# Patient Record
Sex: Female | Born: 1955 | Race: White | Hispanic: No | Smoking: Former smoker
Health system: Southern US, Community
[De-identification: ages and names within clinical notes are randomized; demographics above are authoritative.]

## PROBLEM LIST (undated history)

## (undated) DIAGNOSIS — F32A Depression, unspecified: Secondary | ICD-10-CM

## (undated) DIAGNOSIS — A498 Other bacterial infections of unspecified site: Secondary | ICD-10-CM

## (undated) DIAGNOSIS — K529 Noninfective gastroenteritis and colitis, unspecified: Secondary | ICD-10-CM

## (undated) DIAGNOSIS — G4700 Insomnia, unspecified: Secondary | ICD-10-CM

## (undated) DIAGNOSIS — N83202 Unspecified ovarian cyst, left side: Secondary | ICD-10-CM

## (undated) DIAGNOSIS — M5136 Other intervertebral disc degeneration, lumbar region: Secondary | ICD-10-CM

## (undated) DIAGNOSIS — M4800 Spinal stenosis, site unspecified: Secondary | ICD-10-CM

## (undated) DIAGNOSIS — R002 Palpitations: Secondary | ICD-10-CM

## (undated) DIAGNOSIS — I10 Essential (primary) hypertension: Secondary | ICD-10-CM

## (undated) DIAGNOSIS — D509 Iron deficiency anemia, unspecified: Secondary | ICD-10-CM

## (undated) DIAGNOSIS — C439 Malignant melanoma of skin, unspecified: Secondary | ICD-10-CM

## (undated) DIAGNOSIS — M51369 Other intervertebral disc degeneration, lumbar region without mention of lumbar back pain or lower extremity pain: Secondary | ICD-10-CM

## (undated) DIAGNOSIS — F329 Major depressive disorder, single episode, unspecified: Secondary | ICD-10-CM

## (undated) DIAGNOSIS — E785 Hyperlipidemia, unspecified: Secondary | ICD-10-CM

## (undated) DIAGNOSIS — F419 Anxiety disorder, unspecified: Secondary | ICD-10-CM

## (undated) HISTORY — PX: SHOULDER ARTHROSCOPY DISTAL CLAVICLE EXCISION AND OPEN ROTATOR CUFF REPAIR: SHX2396

## (undated) HISTORY — DX: Malignant melanoma of skin, unspecified: C43.9

## (undated) HISTORY — PX: COLONOSCOPY: SHX174

## (undated) HISTORY — DX: Major depressive disorder, single episode, unspecified: F32.9

## (undated) HISTORY — DX: Anxiety disorder, unspecified: F41.9

## (undated) HISTORY — PX: POLYPECTOMY: SHX149

## (undated) HISTORY — DX: Hyperlipidemia, unspecified: E78.5

## (undated) HISTORY — DX: Iron deficiency anemia, unspecified: D50.9

## (undated) HISTORY — DX: Essential (primary) hypertension: I10

## (undated) HISTORY — DX: Palpitations: R00.2

## (undated) HISTORY — DX: Unspecified ovarian cyst, left side: N83.202

## (undated) HISTORY — PX: MELANOMA EXCISION: SHX5266

## (undated) HISTORY — DX: Other intervertebral disc degeneration, lumbar region without mention of lumbar back pain or lower extremity pain: M51.369

## (undated) HISTORY — DX: Insomnia, unspecified: G47.00

## (undated) HISTORY — DX: Other bacterial infections of unspecified site: A49.8

## (undated) HISTORY — PX: TUBAL LIGATION: SHX77

## (undated) HISTORY — DX: Noninfective gastroenteritis and colitis, unspecified: K52.9

## (undated) HISTORY — PX: MOUTH SURGERY: SHX715

## (undated) HISTORY — DX: Depression, unspecified: F32.A

## (undated) HISTORY — DX: Other intervertebral disc degeneration, lumbar region: M51.36

## (undated) HISTORY — PX: OTHER SURGICAL HISTORY: SHX169

## (undated) HISTORY — PX: UPPER GASTROINTESTINAL ENDOSCOPY: SHX188

## (undated) HISTORY — DX: Spinal stenosis, site unspecified: M48.00

## (undated) HISTORY — PX: BREAST SURGERY: SHX581

---

## 1988-11-07 HISTORY — PX: OVARIAN CYST REMOVAL: SHX89

## 1998-09-21 ENCOUNTER — Other Ambulatory Visit: Admission: RE | Admit: 1998-09-21 | Discharge: 1998-09-21 | Payer: Self-pay | Admitting: Obstetrics & Gynecology

## 1999-12-06 ENCOUNTER — Other Ambulatory Visit: Admission: RE | Admit: 1999-12-06 | Discharge: 1999-12-06 | Payer: Self-pay | Admitting: Obstetrics & Gynecology

## 2001-02-23 ENCOUNTER — Other Ambulatory Visit: Admission: RE | Admit: 2001-02-23 | Discharge: 2001-02-23 | Payer: Self-pay | Admitting: Obstetrics & Gynecology

## 2002-03-06 ENCOUNTER — Other Ambulatory Visit: Admission: RE | Admit: 2002-03-06 | Discharge: 2002-03-06 | Payer: Self-pay | Admitting: Family Medicine

## 2003-03-31 ENCOUNTER — Other Ambulatory Visit: Admission: RE | Admit: 2003-03-31 | Discharge: 2003-03-31 | Payer: Self-pay | Admitting: Obstetrics & Gynecology

## 2003-05-08 ENCOUNTER — Encounter: Admission: RE | Admit: 2003-05-08 | Discharge: 2003-05-08 | Payer: Self-pay | Admitting: Obstetrics & Gynecology

## 2003-05-08 ENCOUNTER — Encounter: Payer: Self-pay | Admitting: Obstetrics & Gynecology

## 2004-07-16 ENCOUNTER — Encounter: Admission: RE | Admit: 2004-07-16 | Discharge: 2004-07-16 | Payer: Self-pay | Admitting: Obstetrics & Gynecology

## 2004-08-02 ENCOUNTER — Other Ambulatory Visit: Admission: RE | Admit: 2004-08-02 | Discharge: 2004-08-02 | Payer: Self-pay | Admitting: Obstetrics & Gynecology

## 2005-08-18 ENCOUNTER — Encounter: Admission: RE | Admit: 2005-08-18 | Discharge: 2005-08-18 | Payer: Self-pay | Admitting: Internal Medicine

## 2005-09-05 ENCOUNTER — Other Ambulatory Visit: Admission: RE | Admit: 2005-09-05 | Discharge: 2005-09-05 | Payer: Self-pay | Admitting: Obstetrics & Gynecology

## 2006-08-15 ENCOUNTER — Ambulatory Visit: Payer: Self-pay | Admitting: Gastroenterology

## 2006-09-14 ENCOUNTER — Encounter: Admission: RE | Admit: 2006-09-14 | Discharge: 2006-09-14 | Payer: Self-pay | Admitting: Internal Medicine

## 2006-10-13 ENCOUNTER — Encounter: Admission: RE | Admit: 2006-10-13 | Discharge: 2006-10-13 | Payer: Self-pay | Admitting: Internal Medicine

## 2006-11-08 ENCOUNTER — Encounter: Admission: RE | Admit: 2006-11-08 | Discharge: 2006-11-08 | Payer: Self-pay | Admitting: General Surgery

## 2007-10-12 ENCOUNTER — Encounter: Admission: RE | Admit: 2007-10-12 | Discharge: 2007-10-12 | Payer: Self-pay | Admitting: Internal Medicine

## 2007-10-30 ENCOUNTER — Encounter: Admission: RE | Admit: 2007-10-30 | Discharge: 2007-10-30 | Payer: Self-pay | Admitting: Internal Medicine

## 2008-01-11 ENCOUNTER — Encounter: Admission: RE | Admit: 2008-01-11 | Discharge: 2008-01-11 | Payer: Self-pay | Admitting: Internal Medicine

## 2008-10-29 ENCOUNTER — Encounter: Admission: RE | Admit: 2008-10-29 | Discharge: 2008-10-29 | Payer: Self-pay | Admitting: Internal Medicine

## 2008-11-06 ENCOUNTER — Encounter: Admission: RE | Admit: 2008-11-06 | Discharge: 2008-11-06 | Payer: Self-pay | Admitting: Internal Medicine

## 2009-10-08 DIAGNOSIS — N83209 Unspecified ovarian cyst, unspecified side: Secondary | ICD-10-CM | POA: Insufficient documentation

## 2009-11-07 HISTORY — PX: MENISCUS REPAIR: SHX5179

## 2010-01-01 ENCOUNTER — Encounter: Admission: RE | Admit: 2010-01-01 | Discharge: 2010-01-01 | Payer: Self-pay | Admitting: Internal Medicine

## 2010-09-24 ENCOUNTER — Encounter: Admission: RE | Admit: 2010-09-24 | Discharge: 2010-09-24 | Payer: Self-pay | Admitting: Internal Medicine

## 2010-11-07 HISTORY — PX: DILATION AND CURETTAGE OF UTERUS: SHX78

## 2010-11-28 ENCOUNTER — Encounter (HOSPITAL_BASED_OUTPATIENT_CLINIC_OR_DEPARTMENT_OTHER): Payer: Self-pay | Admitting: Internal Medicine

## 2010-12-08 ENCOUNTER — Other Ambulatory Visit: Payer: Self-pay | Admitting: Obstetrics & Gynecology

## 2010-12-08 DIAGNOSIS — Z1239 Encounter for other screening for malignant neoplasm of breast: Secondary | ICD-10-CM

## 2010-12-31 ENCOUNTER — Ambulatory Visit
Admission: RE | Admit: 2010-12-31 | Discharge: 2010-12-31 | Disposition: A | Discharge status: Home / Self Care | Payer: BC Managed Care – PPO | Source: Ambulatory Visit | Attending: Obstetrics & Gynecology | Admitting: Obstetrics & Gynecology

## 2010-12-31 DIAGNOSIS — Z1239 Encounter for other screening for malignant neoplasm of breast: Secondary | ICD-10-CM

## 2011-11-08 HISTORY — PX: LAPAROSCOPIC TOTAL HYSTERECTOMY: SUR800

## 2012-01-16 ENCOUNTER — Other Ambulatory Visit: Payer: Self-pay | Admitting: Internal Medicine

## 2012-01-16 DIAGNOSIS — Z1231 Encounter for screening mammogram for malignant neoplasm of breast: Secondary | ICD-10-CM

## 2012-02-10 ENCOUNTER — Ambulatory Visit
Admission: RE | Admit: 2012-02-10 | Discharge: 2012-02-10 | Disposition: A | Discharge status: Home / Self Care | Payer: BC Managed Care – PPO | Source: Ambulatory Visit | Attending: Internal Medicine | Admitting: Internal Medicine

## 2012-02-10 DIAGNOSIS — Z1231 Encounter for screening mammogram for malignant neoplasm of breast: Secondary | ICD-10-CM

## 2012-03-30 ENCOUNTER — Other Ambulatory Visit: Payer: Self-pay | Admitting: Internal Medicine

## 2012-03-30 DIAGNOSIS — M549 Dorsalgia, unspecified: Secondary | ICD-10-CM

## 2012-04-04 ENCOUNTER — Ambulatory Visit
Admission: RE | Admit: 2012-04-04 | Discharge: 2012-04-04 | Disposition: A | Discharge status: Home / Self Care | Payer: BC Managed Care – PPO | Source: Ambulatory Visit | Attending: Internal Medicine | Admitting: Internal Medicine

## 2012-04-04 DIAGNOSIS — M549 Dorsalgia, unspecified: Secondary | ICD-10-CM

## 2012-11-16 DIAGNOSIS — E785 Hyperlipidemia, unspecified: Secondary | ICD-10-CM | POA: Insufficient documentation

## 2012-11-28 DIAGNOSIS — R195 Other fecal abnormalities: Secondary | ICD-10-CM | POA: Insufficient documentation

## 2012-12-24 ENCOUNTER — Encounter: Payer: Self-pay | Admitting: Gastroenterology

## 2012-12-25 ENCOUNTER — Encounter: Payer: Self-pay | Admitting: Gastroenterology

## 2012-12-25 ENCOUNTER — Encounter: Payer: Self-pay | Admitting: Internal Medicine

## 2012-12-26 ENCOUNTER — Ambulatory Visit: Payer: BC Managed Care – PPO | Admitting: Gastroenterology

## 2013-01-09 ENCOUNTER — Other Ambulatory Visit: Payer: Self-pay

## 2013-01-09 DIAGNOSIS — Z1231 Encounter for screening mammogram for malignant neoplasm of breast: Secondary | ICD-10-CM

## 2013-01-15 ENCOUNTER — Ambulatory Visit (INDEPENDENT_AMBULATORY_CARE_PROVIDER_SITE_OTHER): Payer: BC Managed Care – PPO | Admitting: Internal Medicine

## 2013-01-15 ENCOUNTER — Encounter: Payer: Self-pay | Admitting: Internal Medicine

## 2013-01-15 VITALS — BP 120/70 | HR 76 | Ht 65.75 in | Wt 182.0 lb

## 2013-01-15 DIAGNOSIS — R1310 Dysphagia, unspecified: Secondary | ICD-10-CM

## 2013-01-15 DIAGNOSIS — R195 Other fecal abnormalities: Secondary | ICD-10-CM

## 2013-01-15 DIAGNOSIS — K219 Gastro-esophageal reflux disease without esophagitis: Secondary | ICD-10-CM

## 2013-01-15 MED ORDER — MOVIPREP 100 G PO SOLR: 1.0000 | Freq: Once | ORAL | Status: DC

## 2013-01-15 NOTE — Patient Instructions (Addendum)
You have been scheduled for an endoscopy and colonoscopy with propofol. Please follow the written instructions given to you at your visit today. Please pick up your prep at the pharmacy within the next 1-3 days. If you use inhalers (even only as needed), please bring them with you on the day of your procedure.    

## 2013-01-15 NOTE — Progress Notes (Signed)
HISTORY OF PRESENT ILLNESS:  Erica Potts is a 57 y.o. female with hypertension, hyperlipidemia, anxiety, and degenerative disc disease. She is sent today regarding Hemoccult-positive stool. The patient underwent recent routine annual evaluation. Hemosure testing returned positive. Review of outside laboratories from January 2014 reveal normal CBC with hemoglobin 14.5. Normal comprehensive metabolic panel and urinalysis. The patient's GI review of systems is remarkable for increased reflux symptoms recently. This has necessitated increased use of as needed Prilosec 20 mg. She is currently using this about 3 times per week. She describes esophageal spasms or tightening of the pharyngeal area with liquids. She's had this for several years. Barium esophagram in November of 2011 (reviewed) to evaluate this was unremarkable. The patient's GI review of systems is otherwise remarkable for occasional constipation secondary to pain medication. She did undergo complete colonoscopy in December of 2007 with Dr. Elbert Ewings. Noe Gens. Correspondence, which has been reviewed, stated that the examination was normal and followup in 10 years recommended. The patient does take regular NSAIDs as well as aspirin. She denies melena or hematochezia. No family history of colon cancer  REVIEW OF SYSTEMS:  All non-GI ROS negative except for chronic back pain and chronic insomnia  Past Medical History  Diagnosis Date  . DDD (degenerative disc disease), lumbar   . Depression   . Insomnia   . Palpitations   . Ovarian cyst, left   . Anxiety   . Melanoma   . HTN (hypertension)   . HLD (hyperlipidemia)     Past Surgical History  Procedure Laterality Date  . Nipple biopsy    . Meniscus repair Left 2011  . Melanoma excision    . Mouth surgery Right     maxilary  . Ovarian cyst removal  1990  . Cesarean section  1983, 1986    x 2  . Tubal ligation    . Dilation and curettage of uterus  2012  . Laparoscopic total  hysterectomy  2013    Social History Erica Potts  reports that she quit smoking about 21 years ago. Her smoking use included Cigarettes. She smoked 1.00 pack per day. She has never used smokeless tobacco. She reports that  drinks alcohol. She reports that she does not use illicit drugs.  family history includes Heart attack in her mother and Prostate cancer in her father.  No Known Allergies     PHYSICAL EXAMINATION: Vital signs: BP 120/70  Pulse 76  Ht 5' 5.75" (1.67 m)  Wt 182 lb (82.555 kg)  BMI 29.6 kg/m2  Constitutional: generally well-appearing, no acute distress Psychiatric: alert and oriented x3, cooperative Eyes: extraocular movements intact, anicteric, conjunctiva pink Mouth: oral pharynx moist, no lesions Neck: supple no lymphadenopathy Cardiovascular: heart regular rate and rhythm, no murmur Lungs: clear to auscultation bilaterally Abdomen: soft, nontender, nondistended, no obvious ascites, no peritoneal signs, normal bowel sounds, no organomegaly Rectal: Deferred until colonoscopy Extremities: no lower extremity edema bilaterally Skin: no lesions on visible extremities Neuro: No focal deficits.   ASSESSMENT:  #1. Hemoccult-positive stool. Rule out GI mucosal lesions such as NSAID-induced lesion or colorectal neoplasia #2. GERD. Worsening symptoms recently #3. Liquid dysphagia. Likely spasm #4. Unremarkable colonoscopy, elsewhere, December 2007  PLAN:  #1. Colonoscopy and upper endoscopy.The nature of the procedure, as well as the risks, benefits, and alternatives were carefully and thoroughly reviewed with the patient. Ample time for discussion and questions allowed. The patient understood, was satisfied, and agreed to proceed. Movi prep prescribed. The patient instructed on its  use

## 2013-02-12 ENCOUNTER — Ambulatory Visit (AMBULATORY_SURGERY_CENTER): Payer: BC Managed Care – PPO | Admitting: Internal Medicine

## 2013-02-12 ENCOUNTER — Encounter: Payer: Self-pay | Admitting: Internal Medicine

## 2013-02-12 ENCOUNTER — Ambulatory Visit: Payer: BC Managed Care – PPO

## 2013-02-12 VITALS — BP 120/73 | HR 65 | Temp 98.2°F | Resp 21 | Ht 65.0 in | Wt 182.0 lb

## 2013-02-12 DIAGNOSIS — R1310 Dysphagia, unspecified: Secondary | ICD-10-CM

## 2013-02-12 DIAGNOSIS — D126 Benign neoplasm of colon, unspecified: Secondary | ICD-10-CM

## 2013-02-12 DIAGNOSIS — K219 Gastro-esophageal reflux disease without esophagitis: Secondary | ICD-10-CM

## 2013-02-12 DIAGNOSIS — R195 Other fecal abnormalities: Secondary | ICD-10-CM

## 2013-02-12 DIAGNOSIS — K259 Gastric ulcer, unspecified as acute or chronic, without hemorrhage or perforation: Secondary | ICD-10-CM

## 2013-02-12 MED ORDER — SODIUM CHLORIDE 0.9 % IV SOLN: 500.0000 mL | INTRAVENOUS | Status: DC

## 2013-02-12 MED ORDER — OMEPRAZOLE 40 MG PO CPDR: 40.0000 mg | DELAYED_RELEASE_CAPSULE | Freq: Every day | ORAL | Status: DC

## 2013-02-12 NOTE — Progress Notes (Signed)
Pt had to wait to speak to Dr. Marina Goodell to receive the procedure findings.maw

## 2013-02-12 NOTE — Progress Notes (Signed)
Called to room to assist during endoscopic procedure.  Patient ID and intended procedure confirmed with present staff. Received instructions for my participation in the procedure from the performing physician.  

## 2013-02-12 NOTE — Progress Notes (Signed)
Patient did not experience any of the following events: a burn prior to discharge; a fall within the facility; wrong site/side/patient/procedure/implant event; or a hospital transfer or hospital admission upon discharge from the facility. (G8907) Patient did not have preoperative order for IV antibiotic SSI prophylaxis. (G8918)  

## 2013-02-12 NOTE — Op Note (Signed)
[facility] [address] [place], [address]   ENDOSCOPY PROCEDURE REPORT  PATIENT: Erica Potts, Erica Potts  MR#: [id number] BIRTHDATE: 1956/06/20 , 56  yrs. old GENDER: Female ENDOSCOPIST: Roxy Cedar, MD REFERRED BY:  Jarome Matin, M.D. PROCEDURE DATE:  02/12/2013 PROCEDURE:  EGD w/ biopsy ASA CLASS:     Class II INDICATIONS:  Heme positive stool.   History of esophageal reflux. Vague dysphagia MEDICATIONS: MAC sedation, administered by CRNA and propofol (Diprivan) 200mg  IV TOPICAL ANESTHETIC: Cetacaine Spray  DESCRIPTION OF PROCEDURE: After the risks benefits and alternatives of the procedure were thoroughly explained, informed consent was obtained.  The LB GIF-H180 [id number] endoscope was introduced through the mouth and advanced to the second portion of the duodenum. Without limitations.  The instrument was slowly withdrawn as the mucosa was fully examined.      EXAM: The esophagus revealed distal esophagitis (Edema, friable) at the Z-line.  Otherwise normal esophagus.  The stomach revealed multiple antral erosions and an 8mm clean based ulcer.  CLO bx taken.  The duodenum was normal.  Retroflexed views revealed no abnormalities.     The scope was then withdrawn from the patient and the procedure completed.  COMPLICATIONS: There were no complications. ENDOSCOPIC IMPRESSION: 1. Esophagitis 2. GERD 3. Antral ulcer and erosions likely secondary to NSAIDS.  RECOMMENDATIONS: 1.  PRESCRIBE OMEPRAZOLE 40 MG DAILY; #30; 11 REFILLS 2.  Avoid NSAIDS (LIKE MOBIC, ADVIL, ALEVE, etc) if possible 3.  Rx CLO if positive 4.  OP GI follow-up is advised on a PRN basis.  REPEAT EXAM:  eSigned:  Roxy Cedar, MD 02/12/2013 3:09 PM  AO:[id number] Eloise Harman, MD and The Patient

## 2013-02-12 NOTE — Patient Instructions (Addendum)
Handouts were given to your care partner on polyps, diverticulosis, high fiber diet, and GERD.  Avoid NSAIDS like mobic, advil, aleve if possible.  You may resume your other current medications.  A rx was sent to [facility] at [address] for omeprazole to take daily 20 - 30 minutes before breakfast.  Please call if any questions or concerns.    YOU HAD AN ENDOSCOPIC PROCEDURE TODAY AT THE [facility]: Refer to the procedure report that was given to you for any specific questions about what was found during the examination.  If the procedure report does not answer your questions, please call your gastroenterologist to clarify.  If you requested that your care partner not be given the details of your procedure findings, then the procedure report has been included in a sealed envelope for you to review at your convenience later.  YOU SHOULD EXPECT: Some feelings of bloating in the abdomen. Passage of more gas than usual.  Walking can help get rid of the air that was put into your GI tract during the procedure and reduce the bloating. If you had a lower endoscopy (such as a colonoscopy or flexible sigmoidoscopy) you may notice spotting of blood in your stool or on the toilet paper. If you underwent a bowel prep for your procedure, then you may not have a normal bowel movement for a few days.  DIET: Your first meal following the procedure should be a light meal and then it is ok to progress to your normal diet.  A half-sandwich or bowl of soup is an example of a good first meal.  Heavy or fried foods are harder to digest and may make you feel nauseous or bloated.  Likewise meals heavy in dairy and vegetables can cause extra gas to form and this can also increase the bloating.  Drink plenty of fluids but you should avoid alcoholic beverages for 24 hours.  ACTIVITY: Your care partner should take you home directly after the procedure.  You should plan to take it easy, moving slowly  for the rest of the day.  You can resume normal activity the day after the procedure however you should NOT DRIVE or use heavy machinery for 24 hours (because of the sedation medicines used during the test).    SYMPTOMS TO REPORT IMMEDIATELY: A gastroenterologist can be reached at any hour.  During normal business hours, 8:30 AM to 5:00 PM Monday through Friday, call [phone].  After hours and on weekends, please call the GI answering service at [phone] who will take a message and have the physician on call contact you.   Following lower endoscopy (colonoscopy or flexible sigmoidoscopy):  Excessive amounts of blood in the stool  Significant tenderness or worsening of abdominal pains  Swelling of the abdomen that is new, acute  Fever of 100F or higher  Following upper endoscopy (EGD)  Vomiting of blood or coffee ground material  New chest pain or pain under the shoulder blades  Painful or persistently difficult swallowing  New shortness of breath  Fever of 100F or higher  Black, tarry-looking stools  FOLLOW UP: If any biopsies were taken you will be contacted by phone or by letter within the next 1-3 weeks.  Call your gastroenterologist if you have not heard about the biopsies in 3 weeks.  Our staff will call the home number listed on your records the next business day following your procedure to check on you and address any questions  or concerns that you may have at that time regarding the information given to you following your procedure. This is a courtesy call and so if there is no answer at the home number and we have not heard from you through the emergency physician on call, we will assume that you have returned to your regular daily activities without incident.  SIGNATURES/CONFIDENTIALITY: You and/or your care partner have signed paperwork which will be entered into your electronic medical record.  These signatures attest to the fact that that the information above on  your After Visit Summary has been reviewed and is understood.  Full responsibility of the confidentiality of this discharge information lies with you and/or your care-partner.

## 2013-02-12 NOTE — Progress Notes (Signed)
No complaints noted in the recovery room. Maw   

## 2013-02-13 ENCOUNTER — Telehealth: Payer: Self-pay | Admitting: *Deleted

## 2013-02-13 NOTE — Telephone Encounter (Signed)
  Follow up Call-  Call back number 02/12/2013  Post procedure Call Back phone  # [phone]  Permission to leave phone message Yes     Patient questions:  Do you have a fever, pain , or abdominal swelling? no Pain Score  0 *  Have you tolerated food without any problems? yes  Have you been able to return to your normal activities? yes  Do you have any questions about your discharge instructions: Diet   no Medications  no Follow up visit  no  Do you have questions or concerns about your Care? no  Actions: * If pain score is 4 or above: No action needed, pain <4.

## 2013-02-15 ENCOUNTER — Ambulatory Visit: Payer: BC Managed Care – PPO

## 2013-02-18 ENCOUNTER — Encounter: Payer: Self-pay | Admitting: Internal Medicine

## 2013-02-18 NOTE — Op Note (Signed)
[facility] [address] [place], [address]   COLONOSCOPY PROCEDURE REPORT  PATIENT: Erica Potts, Erica Potts  MR#: [id number] BIRTHDATE: Mar 14, 1956 , 56  yrs. old GENDER: Female ENDOSCOPIST: Roxy Cedar, MD REFERRED NF:[id number] Eloise Harman, M.D. PROCEDURE DATE:  02/12/2013 PROCEDURE:   Colonoscopy with snare polypectomy    x 2. ASA CLASS:   Class II INDICATIONS:heme-positive stool.   Colonoscopy [place] 10-2006 w/ Diverticulosis MEDICATIONS: MAC sedation, administered by CRNA and propofol (Diprivan) 400mg  IV  DESCRIPTION OF PROCEDURE:   After the risks benefits and alternatives of the procedure were thoroughly explained, informed consent was obtained.  A digital rectal exam revealed no abnormalities of the rectum.   The LB CF-H180AL [id number]  endoscope was introduced through the anus and advanced to the cecum, which was identified by both the appendix and ileocecal valve. No adverse events experienced.   The quality of the prep was good, using MoviPrep  The instrument was then slowly withdrawn as the colon was fully examined.      COLON FINDINGS: Two polyps were found in the ascending colon (7mm sessile)  and rectum (31mm)These were removed with Cold snare, retrieved, and submitted to pathology.   Moderate diverticulosis was noted in the sigmoid colon.   The colon mucosa was otherwise normal.  Retroflexed views revealed internal hemorrhoids. The time to cecum=2 minutes 58 seconds.  Withdrawal time=13 minutes 12 seconds.  The scope was withdrawn and the procedure completed. COMPLICATIONS: There were no complications.  ENDOSCOPIC IMPRESSION: 1.   Two polyps measuring 5-7 mm in size were found in the ascending colon and rectum and removed 2.   Moderate diverticulosis was noted in the sigmoid colon 3.   The colon mucosa was otherwise normal  RECOMMENDATIONS: 1.  Follow up colonoscopy in 5 years 2.  Upper endoscopy today (see report)   eSigned:  Roxy Cedar, MD 02/18/2013 3:55 PM Revised: 02/18/2013 3:55 PM  cc: Jarome Matin, MD and The Patient   PATIENT NAME:  Erica Potts, Erica Potts MR#: [id number]

## 2013-02-19 ENCOUNTER — Ambulatory Visit
Admission: RE | Admit: 2013-02-19 | Discharge: 2013-02-19 | Disposition: A | Discharge status: Home / Self Care | Payer: BC Managed Care – PPO | Source: Ambulatory Visit

## 2013-02-19 DIAGNOSIS — Z1231 Encounter for screening mammogram for malignant neoplasm of breast: Secondary | ICD-10-CM

## 2013-02-26 ENCOUNTER — Telehealth: Payer: Self-pay | Admitting: Internal Medicine

## 2013-02-26 NOTE — Telephone Encounter (Signed)
Dr. Jesusita Oka [facility]'s Office requested Consult Note from 01/15/2013 on 02/18/2013  Faxed 7 pages Office Note from 01/15/2013 and Colon and Endo Reports from 02/12/2013  asw

## 2014-01-15 ENCOUNTER — Other Ambulatory Visit: Payer: Self-pay | Admitting: Internal Medicine

## 2014-04-17 ENCOUNTER — Other Ambulatory Visit: Payer: Self-pay | Admitting: Internal Medicine

## 2014-04-17 DIAGNOSIS — R200 Anesthesia of skin: Secondary | ICD-10-CM

## 2014-04-20 ENCOUNTER — Ambulatory Visit
Admission: RE | Admit: 2014-04-20 | Discharge: 2014-04-20 | Disposition: A | Discharge status: Home / Self Care | Payer: BC Managed Care – PPO | Source: Ambulatory Visit | Attending: Internal Medicine | Admitting: Internal Medicine

## 2014-04-20 DIAGNOSIS — R200 Anesthesia of skin: Secondary | ICD-10-CM

## 2014-05-05 ENCOUNTER — Other Ambulatory Visit: Payer: Self-pay

## 2014-05-05 DIAGNOSIS — Z1231 Encounter for screening mammogram for malignant neoplasm of breast: Secondary | ICD-10-CM

## 2014-06-16 ENCOUNTER — Ambulatory Visit
Admission: RE | Admit: 2014-06-16 | Discharge: 2014-06-16 | Disposition: A | Discharge status: Home / Self Care | Payer: BC Managed Care – PPO | Source: Ambulatory Visit

## 2014-06-16 DIAGNOSIS — Z1231 Encounter for screening mammogram for malignant neoplasm of breast: Secondary | ICD-10-CM

## 2014-06-17 ENCOUNTER — Other Ambulatory Visit: Payer: Self-pay | Admitting: Internal Medicine

## 2014-06-17 DIAGNOSIS — R928 Other abnormal and inconclusive findings on diagnostic imaging of breast: Secondary | ICD-10-CM

## 2014-06-25 ENCOUNTER — Ambulatory Visit
Admission: RE | Admit: 2014-06-25 | Discharge: 2014-06-25 | Disposition: A | Discharge status: Home / Self Care | Payer: BC Managed Care – PPO | Source: Ambulatory Visit | Attending: Internal Medicine | Admitting: Internal Medicine

## 2014-06-25 DIAGNOSIS — R928 Other abnormal and inconclusive findings on diagnostic imaging of breast: Secondary | ICD-10-CM

## 2014-12-31 ENCOUNTER — Other Ambulatory Visit: Payer: Self-pay | Admitting: Internal Medicine

## 2014-12-31 DIAGNOSIS — N6012 Diffuse cystic mastopathy of left breast: Secondary | ICD-10-CM

## 2015-01-21 ENCOUNTER — Other Ambulatory Visit: Payer: Self-pay | Admitting: Internal Medicine

## 2015-01-21 ENCOUNTER — Other Ambulatory Visit: Payer: Self-pay

## 2015-01-28 ENCOUNTER — Ambulatory Visit
Admission: RE | Admit: 2015-01-28 | Discharge: 2015-01-28 | Disposition: A | Discharge status: Home / Self Care | Payer: BLUE CROSS/BLUE SHIELD | Source: Ambulatory Visit | Attending: Internal Medicine | Admitting: Internal Medicine

## 2015-01-28 DIAGNOSIS — N6012 Diffuse cystic mastopathy of left breast: Secondary | ICD-10-CM

## 2015-02-26 ENCOUNTER — Other Ambulatory Visit: Payer: Self-pay | Admitting: Obstetrics & Gynecology

## 2015-02-27 LAB — CYTOLOGY - PAP

## 2015-03-09 ENCOUNTER — Other Ambulatory Visit (HOSPITAL_COMMUNITY): Payer: Self-pay | Admitting: Internal Medicine

## 2015-03-09 DIAGNOSIS — D72829 Elevated white blood cell count, unspecified: Secondary | ICD-10-CM

## 2015-03-16 ENCOUNTER — Encounter (HOSPITAL_COMMUNITY)
Admission: RE | Admit: 2015-03-16 | Discharge: 2015-03-16 | Disposition: A | Discharge status: Home / Self Care | Payer: BLUE CROSS/BLUE SHIELD | Source: Ambulatory Visit | Attending: Internal Medicine | Admitting: Internal Medicine

## 2015-03-16 ENCOUNTER — Other Ambulatory Visit: Payer: Self-pay | Admitting: Internal Medicine

## 2015-03-16 ENCOUNTER — Encounter (HOSPITAL_COMMUNITY): Payer: BLUE CROSS/BLUE SHIELD

## 2015-03-16 DIAGNOSIS — D72829 Elevated white blood cell count, unspecified: Secondary | ICD-10-CM | POA: Insufficient documentation

## 2015-03-16 MED ORDER — TECHNETIUM TC 99M MEDRONATE IV KIT
25.0000 | PACK | Freq: Once | INTRAVENOUS | Status: AC | PRN
  Administered 2015-03-16: 25 via INTRAVENOUS

## 2015-03-23 ENCOUNTER — Other Ambulatory Visit: Payer: Self-pay | Admitting: Internal Medicine

## 2015-05-17 ENCOUNTER — Other Ambulatory Visit: Payer: Self-pay | Admitting: Internal Medicine

## 2015-08-19 ENCOUNTER — Other Ambulatory Visit: Payer: Self-pay | Admitting: Internal Medicine

## 2015-08-19 ENCOUNTER — Other Ambulatory Visit: Payer: Self-pay | Admitting: Obstetrics & Gynecology

## 2015-08-19 DIAGNOSIS — N6320 Unspecified lump in the left breast, unspecified quadrant: Secondary | ICD-10-CM

## 2015-09-02 ENCOUNTER — Ambulatory Visit
Admission: RE | Admit: 2015-09-02 | Discharge: 2015-09-02 | Disposition: A | Discharge status: Home / Self Care | Payer: BLUE CROSS/BLUE SHIELD | Source: Ambulatory Visit | Attending: Obstetrics & Gynecology | Admitting: Obstetrics & Gynecology

## 2015-09-02 DIAGNOSIS — N6320 Unspecified lump in the left breast, unspecified quadrant: Secondary | ICD-10-CM

## 2016-02-18 DIAGNOSIS — M542 Cervicalgia: Secondary | ICD-10-CM | POA: Diagnosis not present

## 2016-03-07 DIAGNOSIS — M542 Cervicalgia: Secondary | ICD-10-CM | POA: Diagnosis not present

## 2016-03-10 DIAGNOSIS — Z6829 Body mass index (BMI) 29.0-29.9, adult: Secondary | ICD-10-CM | POA: Diagnosis not present

## 2016-03-10 DIAGNOSIS — Z01419 Encounter for gynecological examination (general) (routine) without abnormal findings: Secondary | ICD-10-CM | POA: Diagnosis not present

## 2016-03-29 DIAGNOSIS — M542 Cervicalgia: Secondary | ICD-10-CM | POA: Diagnosis not present

## 2016-04-07 DIAGNOSIS — M542 Cervicalgia: Secondary | ICD-10-CM | POA: Diagnosis not present

## 2016-04-19 DIAGNOSIS — M542 Cervicalgia: Secondary | ICD-10-CM | POA: Diagnosis not present

## 2016-05-04 DIAGNOSIS — M542 Cervicalgia: Secondary | ICD-10-CM | POA: Diagnosis not present

## 2016-05-11 DIAGNOSIS — M542 Cervicalgia: Secondary | ICD-10-CM | POA: Diagnosis not present

## 2016-05-23 DIAGNOSIS — M542 Cervicalgia: Secondary | ICD-10-CM | POA: Diagnosis not present

## 2016-06-13 DIAGNOSIS — M542 Cervicalgia: Secondary | ICD-10-CM | POA: Diagnosis not present

## 2016-06-27 DIAGNOSIS — M25561 Pain in right knee: Secondary | ICD-10-CM | POA: Diagnosis not present

## 2016-06-27 DIAGNOSIS — Z6829 Body mass index (BMI) 29.0-29.9, adult: Secondary | ICD-10-CM | POA: Diagnosis not present

## 2016-07-21 DIAGNOSIS — M542 Cervicalgia: Secondary | ICD-10-CM | POA: Diagnosis not present

## 2016-08-10 DIAGNOSIS — M542 Cervicalgia: Secondary | ICD-10-CM | POA: Diagnosis not present

## 2016-08-12 ENCOUNTER — Other Ambulatory Visit: Payer: Self-pay | Admitting: Obstetrics & Gynecology

## 2016-08-12 DIAGNOSIS — R921 Mammographic calcification found on diagnostic imaging of breast: Secondary | ICD-10-CM

## 2016-08-26 DIAGNOSIS — N390 Urinary tract infection, site not specified: Secondary | ICD-10-CM | POA: Diagnosis not present

## 2016-08-26 DIAGNOSIS — R8299 Other abnormal findings in urine: Secondary | ICD-10-CM | POA: Diagnosis not present

## 2016-08-31 DIAGNOSIS — M542 Cervicalgia: Secondary | ICD-10-CM | POA: Diagnosis not present

## 2016-09-13 DIAGNOSIS — H1013 Acute atopic conjunctivitis, bilateral: Secondary | ICD-10-CM | POA: Diagnosis not present

## 2016-09-15 DIAGNOSIS — S83241D Other tear of medial meniscus, current injury, right knee, subsequent encounter: Secondary | ICD-10-CM | POA: Diagnosis not present

## 2016-09-21 DIAGNOSIS — M542 Cervicalgia: Secondary | ICD-10-CM | POA: Diagnosis not present

## 2016-09-28 DIAGNOSIS — S83231A Complex tear of medial meniscus, current injury, right knee, initial encounter: Secondary | ICD-10-CM | POA: Diagnosis not present

## 2016-09-28 DIAGNOSIS — M94261 Chondromalacia, right knee: Secondary | ICD-10-CM | POA: Diagnosis not present

## 2016-09-28 DIAGNOSIS — S83281A Other tear of lateral meniscus, current injury, right knee, initial encounter: Secondary | ICD-10-CM | POA: Diagnosis not present

## 2016-09-28 DIAGNOSIS — Y999 Unspecified external cause status: Secondary | ICD-10-CM | POA: Diagnosis not present

## 2016-09-28 DIAGNOSIS — G8918 Other acute postprocedural pain: Secondary | ICD-10-CM | POA: Diagnosis not present

## 2016-09-28 DIAGNOSIS — S83241A Other tear of medial meniscus, current injury, right knee, initial encounter: Secondary | ICD-10-CM | POA: Diagnosis not present

## 2016-10-06 DIAGNOSIS — S83281D Other tear of lateral meniscus, current injury, right knee, subsequent encounter: Secondary | ICD-10-CM | POA: Diagnosis not present

## 2016-10-06 DIAGNOSIS — S83241D Other tear of medial meniscus, current injury, right knee, subsequent encounter: Secondary | ICD-10-CM | POA: Diagnosis not present

## 2016-10-12 DIAGNOSIS — M542 Cervicalgia: Secondary | ICD-10-CM | POA: Diagnosis not present

## 2016-10-14 ENCOUNTER — Other Ambulatory Visit: Payer: Self-pay | Admitting: Obstetrics & Gynecology

## 2016-10-14 ENCOUNTER — Ambulatory Visit
Admission: RE | Admit: 2016-10-14 | Discharge: 2016-10-14 | Disposition: A | Discharge status: Home / Self Care | Payer: BLUE CROSS/BLUE SHIELD | Source: Ambulatory Visit | Attending: Obstetrics & Gynecology | Admitting: Obstetrics & Gynecology

## 2016-10-14 DIAGNOSIS — N6320 Unspecified lump in the left breast, unspecified quadrant: Secondary | ICD-10-CM

## 2016-10-14 DIAGNOSIS — R921 Mammographic calcification found on diagnostic imaging of breast: Secondary | ICD-10-CM

## 2016-10-14 DIAGNOSIS — N6012 Diffuse cystic mastopathy of left breast: Secondary | ICD-10-CM | POA: Diagnosis not present

## 2016-10-20 DIAGNOSIS — L812 Freckles: Secondary | ICD-10-CM | POA: Diagnosis not present

## 2016-10-20 DIAGNOSIS — D2272 Melanocytic nevi of left lower limb, including hip: Secondary | ICD-10-CM | POA: Diagnosis not present

## 2016-10-20 DIAGNOSIS — D225 Melanocytic nevi of trunk: Secondary | ICD-10-CM | POA: Diagnosis not present

## 2016-10-20 DIAGNOSIS — Z85820 Personal history of malignant melanoma of skin: Secondary | ICD-10-CM | POA: Diagnosis not present

## 2016-11-03 DIAGNOSIS — S83281D Other tear of lateral meniscus, current injury, right knee, subsequent encounter: Secondary | ICD-10-CM | POA: Diagnosis not present

## 2016-11-03 DIAGNOSIS — S83241D Other tear of medial meniscus, current injury, right knee, subsequent encounter: Secondary | ICD-10-CM | POA: Diagnosis not present

## 2016-11-08 DIAGNOSIS — M542 Cervicalgia: Secondary | ICD-10-CM | POA: Diagnosis not present

## 2016-11-28 DIAGNOSIS — M542 Cervicalgia: Secondary | ICD-10-CM | POA: Diagnosis not present

## 2016-12-12 DIAGNOSIS — S83241D Other tear of medial meniscus, current injury, right knee, subsequent encounter: Secondary | ICD-10-CM | POA: Diagnosis not present

## 2016-12-19 DIAGNOSIS — M542 Cervicalgia: Secondary | ICD-10-CM | POA: Diagnosis not present

## 2016-12-29 DIAGNOSIS — E784 Other hyperlipidemia: Secondary | ICD-10-CM | POA: Diagnosis not present

## 2016-12-29 DIAGNOSIS — I10 Essential (primary) hypertension: Secondary | ICD-10-CM | POA: Diagnosis not present

## 2016-12-29 DIAGNOSIS — Z0000 Encounter for general adult medical examination without abnormal findings: Secondary | ICD-10-CM | POA: Diagnosis not present

## 2017-01-05 DIAGNOSIS — M545 Low back pain: Secondary | ICD-10-CM | POA: Diagnosis not present

## 2017-01-05 DIAGNOSIS — E784 Other hyperlipidemia: Secondary | ICD-10-CM | POA: Diagnosis not present

## 2017-01-05 DIAGNOSIS — Z0000 Encounter for general adult medical examination without abnormal findings: Secondary | ICD-10-CM | POA: Diagnosis not present

## 2017-01-05 DIAGNOSIS — G4709 Other insomnia: Secondary | ICD-10-CM | POA: Diagnosis not present

## 2017-01-05 DIAGNOSIS — Z1389 Encounter for screening for other disorder: Secondary | ICD-10-CM | POA: Diagnosis not present

## 2017-01-05 DIAGNOSIS — I10 Essential (primary) hypertension: Secondary | ICD-10-CM | POA: Diagnosis not present

## 2017-01-09 DIAGNOSIS — M542 Cervicalgia: Secondary | ICD-10-CM | POA: Diagnosis not present

## 2017-01-19 DIAGNOSIS — Z1212 Encounter for screening for malignant neoplasm of rectum: Secondary | ICD-10-CM | POA: Diagnosis not present

## 2017-02-02 DIAGNOSIS — M542 Cervicalgia: Secondary | ICD-10-CM | POA: Diagnosis not present

## 2017-02-23 DIAGNOSIS — M542 Cervicalgia: Secondary | ICD-10-CM | POA: Diagnosis not present

## 2017-03-10 ENCOUNTER — Encounter: Payer: Self-pay | Admitting: Internal Medicine

## 2017-03-13 DIAGNOSIS — M542 Cervicalgia: Secondary | ICD-10-CM | POA: Diagnosis not present

## 2017-03-21 DIAGNOSIS — Z6829 Body mass index (BMI) 29.0-29.9, adult: Secondary | ICD-10-CM | POA: Diagnosis not present

## 2017-03-21 DIAGNOSIS — M79671 Pain in right foot: Secondary | ICD-10-CM | POA: Diagnosis not present

## 2017-04-06 DIAGNOSIS — Z6828 Body mass index (BMI) 28.0-28.9, adult: Secondary | ICD-10-CM | POA: Diagnosis not present

## 2017-04-06 DIAGNOSIS — Z01419 Encounter for gynecological examination (general) (routine) without abnormal findings: Secondary | ICD-10-CM | POA: Diagnosis not present

## 2017-04-07 DIAGNOSIS — M542 Cervicalgia: Secondary | ICD-10-CM | POA: Diagnosis not present

## 2017-05-01 DIAGNOSIS — M542 Cervicalgia: Secondary | ICD-10-CM | POA: Diagnosis not present

## 2017-05-22 DIAGNOSIS — Z1212 Encounter for screening for malignant neoplasm of rectum: Secondary | ICD-10-CM | POA: Diagnosis not present

## 2017-05-22 DIAGNOSIS — M542 Cervicalgia: Secondary | ICD-10-CM | POA: Diagnosis not present

## 2017-06-19 DIAGNOSIS — M542 Cervicalgia: Secondary | ICD-10-CM | POA: Diagnosis not present

## 2017-07-13 DIAGNOSIS — M542 Cervicalgia: Secondary | ICD-10-CM | POA: Diagnosis not present

## 2017-08-03 DIAGNOSIS — M542 Cervicalgia: Secondary | ICD-10-CM | POA: Diagnosis not present

## 2017-08-30 DIAGNOSIS — M542 Cervicalgia: Secondary | ICD-10-CM | POA: Diagnosis not present

## 2017-09-03 DIAGNOSIS — Z23 Encounter for immunization: Secondary | ICD-10-CM | POA: Diagnosis not present

## 2017-09-20 DIAGNOSIS — M542 Cervicalgia: Secondary | ICD-10-CM | POA: Diagnosis not present

## 2017-10-12 DIAGNOSIS — M542 Cervicalgia: Secondary | ICD-10-CM | POA: Diagnosis not present

## 2017-10-23 DIAGNOSIS — Z85820 Personal history of malignant melanoma of skin: Secondary | ICD-10-CM | POA: Diagnosis not present

## 2017-10-23 DIAGNOSIS — L821 Other seborrheic keratosis: Secondary | ICD-10-CM | POA: Diagnosis not present

## 2017-10-23 DIAGNOSIS — L812 Freckles: Secondary | ICD-10-CM | POA: Diagnosis not present

## 2017-10-23 DIAGNOSIS — D225 Melanocytic nevi of trunk: Secondary | ICD-10-CM | POA: Diagnosis not present

## 2017-11-08 DIAGNOSIS — M542 Cervicalgia: Secondary | ICD-10-CM | POA: Diagnosis not present

## 2017-11-24 ENCOUNTER — Other Ambulatory Visit: Payer: Self-pay | Admitting: Internal Medicine

## 2017-11-24 DIAGNOSIS — Z1231 Encounter for screening mammogram for malignant neoplasm of breast: Secondary | ICD-10-CM

## 2017-11-27 DIAGNOSIS — M542 Cervicalgia: Secondary | ICD-10-CM | POA: Diagnosis not present

## 2017-12-18 DIAGNOSIS — M542 Cervicalgia: Secondary | ICD-10-CM | POA: Diagnosis not present

## 2017-12-21 ENCOUNTER — Ambulatory Visit
Admission: RE | Admit: 2017-12-21 | Discharge: 2017-12-21 | Disposition: A | Discharge status: Home / Self Care | Payer: BLUE CROSS/BLUE SHIELD | Source: Ambulatory Visit | Attending: Internal Medicine | Admitting: Internal Medicine

## 2017-12-21 DIAGNOSIS — Z1231 Encounter for screening mammogram for malignant neoplasm of breast: Secondary | ICD-10-CM | POA: Diagnosis not present

## 2018-01-04 DIAGNOSIS — Z0000 Encounter for general adult medical examination without abnormal findings: Secondary | ICD-10-CM | POA: Diagnosis not present

## 2018-01-04 DIAGNOSIS — I10 Essential (primary) hypertension: Secondary | ICD-10-CM | POA: Diagnosis not present

## 2018-01-04 DIAGNOSIS — R82998 Other abnormal findings in urine: Secondary | ICD-10-CM | POA: Diagnosis not present

## 2018-01-11 DIAGNOSIS — F418 Other specified anxiety disorders: Secondary | ICD-10-CM | POA: Diagnosis not present

## 2018-01-11 DIAGNOSIS — E7849 Other hyperlipidemia: Secondary | ICD-10-CM | POA: Diagnosis not present

## 2018-01-11 DIAGNOSIS — Z1389 Encounter for screening for other disorder: Secondary | ICD-10-CM | POA: Diagnosis not present

## 2018-01-11 DIAGNOSIS — Z0000 Encounter for general adult medical examination without abnormal findings: Secondary | ICD-10-CM | POA: Diagnosis not present

## 2018-01-11 DIAGNOSIS — F3289 Other specified depressive episodes: Secondary | ICD-10-CM | POA: Diagnosis not present

## 2018-01-11 DIAGNOSIS — G4709 Other insomnia: Secondary | ICD-10-CM | POA: Diagnosis not present

## 2018-01-12 DIAGNOSIS — Z1212 Encounter for screening for malignant neoplasm of rectum: Secondary | ICD-10-CM | POA: Diagnosis not present

## 2018-01-16 DIAGNOSIS — M542 Cervicalgia: Secondary | ICD-10-CM | POA: Diagnosis not present

## 2018-02-08 DIAGNOSIS — M542 Cervicalgia: Secondary | ICD-10-CM | POA: Diagnosis not present

## 2018-02-27 DIAGNOSIS — M542 Cervicalgia: Secondary | ICD-10-CM | POA: Diagnosis not present

## 2018-03-14 ENCOUNTER — Encounter: Payer: Self-pay | Admitting: Internal Medicine

## 2018-03-20 DIAGNOSIS — M542 Cervicalgia: Secondary | ICD-10-CM | POA: Diagnosis not present

## 2018-04-04 DIAGNOSIS — H9201 Otalgia, right ear: Secondary | ICD-10-CM | POA: Diagnosis not present

## 2018-04-16 DIAGNOSIS — M542 Cervicalgia: Secondary | ICD-10-CM | POA: Diagnosis not present

## 2018-04-30 DIAGNOSIS — Z6820 Body mass index (BMI) 20.0-20.9, adult: Secondary | ICD-10-CM | POA: Diagnosis not present

## 2018-04-30 DIAGNOSIS — Z01419 Encounter for gynecological examination (general) (routine) without abnormal findings: Secondary | ICD-10-CM | POA: Diagnosis not present

## 2018-05-14 ENCOUNTER — Encounter: Payer: Self-pay | Admitting: Internal Medicine

## 2018-05-28 DIAGNOSIS — M542 Cervicalgia: Secondary | ICD-10-CM | POA: Diagnosis not present

## 2018-06-18 DIAGNOSIS — M542 Cervicalgia: Secondary | ICD-10-CM | POA: Diagnosis not present

## 2018-07-06 DIAGNOSIS — H10413 Chronic giant papillary conjunctivitis, bilateral: Secondary | ICD-10-CM | POA: Diagnosis not present

## 2018-07-12 DIAGNOSIS — M542 Cervicalgia: Secondary | ICD-10-CM | POA: Diagnosis not present

## 2018-07-13 DIAGNOSIS — Z23 Encounter for immunization: Secondary | ICD-10-CM | POA: Diagnosis not present

## 2018-07-13 DIAGNOSIS — R05 Cough: Secondary | ICD-10-CM | POA: Diagnosis not present

## 2018-07-13 DIAGNOSIS — J40 Bronchitis, not specified as acute or chronic: Secondary | ICD-10-CM | POA: Diagnosis not present

## 2018-07-13 DIAGNOSIS — J329 Chronic sinusitis, unspecified: Secondary | ICD-10-CM | POA: Diagnosis not present

## 2018-07-19 DIAGNOSIS — R05 Cough: Secondary | ICD-10-CM | POA: Diagnosis not present

## 2018-07-19 DIAGNOSIS — J40 Bronchitis, not specified as acute or chronic: Secondary | ICD-10-CM | POA: Diagnosis not present

## 2018-07-19 DIAGNOSIS — Z6828 Body mass index (BMI) 28.0-28.9, adult: Secondary | ICD-10-CM | POA: Diagnosis not present

## 2018-07-19 DIAGNOSIS — I10 Essential (primary) hypertension: Secondary | ICD-10-CM | POA: Diagnosis not present

## 2018-07-20 ENCOUNTER — Ambulatory Visit (AMBULATORY_SURGERY_CENTER): Payer: Self-pay | Admitting: *Deleted

## 2018-07-20 ENCOUNTER — Telehealth: Payer: Self-pay | Admitting: *Deleted

## 2018-07-20 ENCOUNTER — Encounter: Payer: Self-pay | Admitting: Internal Medicine

## 2018-07-20 VITALS — Ht 66.5 in | Wt 177.0 lb

## 2018-07-20 DIAGNOSIS — Z86010 Personal history of colonic polyps: Secondary | ICD-10-CM

## 2018-07-20 MED ORDER — NA SULFATE-K SULFATE-MG SULF 17.5-3.13-1.6 GM/177ML PO SOLN
ORAL | 0 refills | Status: DC
Start: 2018-07-20 — End: 2018-08-03

## 2018-07-20 NOTE — Telephone Encounter (Signed)
Spoke with patient. She is aware the EGD will be added with colon date/time. Pt confirmed that she is taking the PPI daily.

## 2018-07-20 NOTE — Telephone Encounter (Signed)
Okay to add on EGD. Please make sure that she is taking her PPI every day

## 2018-07-20 NOTE — Progress Notes (Signed)
Patient denies any allergies to eggs or soy. Patient denies any problems with anesthesia/sedation. Patient denies any oxygen use at home. Patient denies taking any diet/weight loss medications or blood thinners. EMMI education offered, pt declined. Suprep coupon $50 given to pt.

## 2018-07-20 NOTE — Telephone Encounter (Signed)
Patient is for recall colon on 08/03/18,hx colon polyps. Pt last colon/EGD was 2014. She states she has had a bleeding ulcer in the past. During her EGD an ulcer was found and bx's done. She states she is taking medications that irritate her stomach, meloxicam, hydrocodone and is on prednisone. During pv today she was wondering if she should have a repeat EGD due to her hx. Pt denies any GI problems. [place] for EGD with colon? Please advise. Thank you, Robbin pv

## 2018-07-20 NOTE — Telephone Encounter (Signed)
Called pt. No answer, left message. Explained to pt that she may have the EGD with colon same appointment date and time. She did tell me during the pv today that she is taking the PPI omeprazole daily.

## 2018-08-03 ENCOUNTER — Encounter: Payer: Self-pay | Admitting: Internal Medicine

## 2018-08-03 ENCOUNTER — Ambulatory Visit (AMBULATORY_SURGERY_CENTER): Payer: BLUE CROSS/BLUE SHIELD | Admitting: Internal Medicine

## 2018-08-03 ENCOUNTER — Encounter: Payer: BLUE CROSS/BLUE SHIELD | Admitting: Internal Medicine

## 2018-08-03 VITALS — BP 117/68 | HR 79 | Temp 98.0°F | Resp 13 | Ht 65.0 in | Wt 182.0 lb

## 2018-08-03 DIAGNOSIS — B3781 Candidal esophagitis: Secondary | ICD-10-CM

## 2018-08-03 DIAGNOSIS — K5730 Diverticulosis of large intestine without perforation or abscess without bleeding: Secondary | ICD-10-CM

## 2018-08-03 DIAGNOSIS — Z8711 Personal history of peptic ulcer disease: Secondary | ICD-10-CM

## 2018-08-03 DIAGNOSIS — K317 Polyp of stomach and duodenum: Secondary | ICD-10-CM | POA: Diagnosis not present

## 2018-08-03 DIAGNOSIS — Z8719 Personal history of other diseases of the digestive system: Secondary | ICD-10-CM

## 2018-08-03 DIAGNOSIS — R1013 Epigastric pain: Secondary | ICD-10-CM

## 2018-08-03 DIAGNOSIS — Z86010 Personal history of colonic polyps: Secondary | ICD-10-CM | POA: Diagnosis not present

## 2018-08-03 DIAGNOSIS — Z1211 Encounter for screening for malignant neoplasm of colon: Secondary | ICD-10-CM | POA: Diagnosis not present

## 2018-08-03 DIAGNOSIS — K259 Gastric ulcer, unspecified as acute or chronic, without hemorrhage or perforation: Secondary | ICD-10-CM

## 2018-08-03 MED ORDER — FLUCONAZOLE 100 MG PO TABS: ORAL_TABLET | ORAL | 0 refills | Status: DC

## 2018-08-03 MED ORDER — SODIUM CHLORIDE 0.9 % IV SOLN: 500.0000 mL | Freq: Once | INTRAVENOUS | Status: DC

## 2018-08-03 NOTE — Op Note (Signed)
[facility] Patient Name: Erica Potts Procedure Date: 08/03/2018 10:28 AM MRN: [id number] Endoscopist: Docia Chuck. Henrene Pastor , MD Age: 62 Referring MD:  Date of Birth: 08-Jan-1956 Gender: Female Account #: [id number] Procedure:                Upper GI endoscopy, with biopsies Indications:              Dyspepsia, Follow-up of acute gastric ulcer Medicines:                Monitored Anesthesia Care Procedure:                Pre-Anesthesia Assessment:                           - Prior to the procedure, a History and Physical                            was performed, and patient medications and                            allergies were reviewed. The patient's tolerance of                            previous anesthesia was also reviewed. The risks                            and benefits of the procedure and the sedation                            options and risks were discussed with the patient.                            All questions were answered, and informed consent                            was obtained. Prior Anticoagulants: The patient has                            taken no previous anticoagulant or antiplatelet                            agents. ASA Grade Assessment: II - A patient with                            mild systemic disease. After reviewing the risks                            and benefits, the patient was deemed in                            satisfactory condition to undergo the procedure.                           After obtaining informed consent, the endoscope was  passed under direct vision. Throughout the                            procedure, the patient's blood pressure, pulse, and                            oxygen saturations were monitored continuously. The                            Endoscope was introduced through the mouth, and                            advanced to the second part of duodenum. The upper          GI endoscopy was accomplished without difficulty.                            The patient tolerated the procedure well. Scope In: Scope Out: Findings:                 Plaques were found in the upper third of the                            esophagus classic for esophageal candidiasis.                           The esophagus was otherwise normal.                           The stomach revealed several flat polyps in the                            proximal portion most likely hyperplastic. These                            were all less than 1 cm. Biopsies were taken with a                            cold forceps for histology. No active ulcer disease.                           The examined duodenum was normal.                           The cardia and gastric fundus were normal on                            retroflexion. Complications:            No immediate complications. Estimated Blood Loss:     Estimated blood loss: none. Impression:               1. Esophageal candidiasis                           2. Small gastric polyp status post biopsies  3. Otherwise normal exam. No recurrent ulcer                            disease. Recommendation:           1. Prescribed Diflucan 100 mg; #6; take 200 mg on                            day 1 then 100 mg daily until completed. No refills                           2. Continue omeprazole daily as long as you are                            using NSAIDs                           3. Follow-up biopsies Docia Chuck. Henrene Pastor, MD 08/03/2018 11:12:03 AM This report has been signed electronically.

## 2018-08-03 NOTE — Progress Notes (Signed)
A/ox3 pleased with MAC, report to RN 

## 2018-08-03 NOTE — Patient Instructions (Signed)
YOU HAD AN ENDOSCOPIC PROCEDURE TODAY AT THE [facility]:   Refer to the procedure report that was given to you for any specific questions about what was found during the examination.  If the procedure report does not answer your questions, please call your gastroenterologist to clarify.  If you requested that your care partner not be given the details of your procedure findings, then the procedure report has been included in a sealed envelope for you to review at your convenience later.  YOU SHOULD EXPECT: Some feelings of bloating in the abdomen. Passage of more gas than usual.  Walking can help get rid of the air that was put into your GI tract during the procedure and reduce the bloating. If you had a lower endoscopy (such as a colonoscopy or flexible sigmoidoscopy) you may notice spotting of blood in your stool or on the toilet paper. If you underwent a bowel prep for your procedure, you may not have a normal bowel movement for a few days.  Please Note:  You might notice some irritation and congestion in your nose or some drainage.  This is from the oxygen used during your procedure.  There is no need for concern and it should clear up in a day or so.  SYMPTOMS TO REPORT IMMEDIATELY:   Following lower endoscopy (colonoscopy or flexible sigmoidoscopy):  Excessive amounts of blood in the stool  Significant tenderness or worsening of abdominal pains  Swelling of the abdomen that is new, acute  Fever of 100F or higher   Following upper endoscopy (EGD)  Vomiting of blood or coffee ground material  New chest pain or pain under the shoulder blades  Painful or persistently difficult swallowing  New shortness of breath  Fever of 100F or higher  Black, tarry-looking stools  For urgent or emergent issues, a gastroenterologist can be reached at any hour by calling [phone].   DIET:  We do recommend a small meal at first, but then you may proceed to your regular diet.  Drink  plenty of fluids but you should avoid alcoholic beverages for 24 hours.  ACTIVITY:  You should plan to take it easy for the rest of today and you should NOT DRIVE or use heavy machinery until tomorrow (because of the sedation medicines used during the test).    FOLLOW UP: Our staff will call the number listed on your records the next business day following your procedure to check on you and address any questions or concerns that you may have regarding the information given to you following your procedure. If we do not reach you, we will leave a message.  However, if you are feeling well and you are not experiencing any problems, there is no need to return our call.  We will assume that you have returned to your regular daily activities without incident.  If any biopsies were taken you will be contacted by phone or by letter within the next 1-3 weeks.  Please call us at [phone] if you have not heard about the biopsies in 3 weeks.    SIGNATURES/CONFIDENTIALITY: You and/or your care partner have signed paperwork which will be entered into your electronic medical record.  These signatures attest to the fact that that the information above on your After Visit Summary has been reviewed and is understood.  Full responsibility of the confidentiality of this discharge information lies with you and/or your care-partner. 

## 2018-08-03 NOTE — Progress Notes (Signed)
Called to room to assist during endoscopic procedure.  Patient ID and intended procedure confirmed with present staff. Received instructions for my participation in the procedure from the performing physician.  

## 2018-08-03 NOTE — Progress Notes (Signed)
Pt's states no medical or surgical changes since previsit or office visit. 

## 2018-08-03 NOTE — Op Note (Signed)
[place] Patient Name: Erica Potts Procedure Date: 08/03/2018 10:28 AM MRN: [id number] Endoscopist: Docia Chuck. Henrene Pastor , MD Age: 62 Referring MD:  Date of Birth: 30-Nov-1955 Gender: Female Account #: [id number] Procedure:                Colonoscopy Indications:              High risk colon cancer surveillance: Personal                            history of non-advanced adenoma Medicines:                Monitored Anesthesia Care Procedure:                Pre-Anesthesia Assessment:                           - Prior to the procedure, a History and Physical                            was performed, and patient medications and                            allergies were reviewed. The patient's tolerance of                            previous anesthesia was also reviewed. The risks                            and benefits of the procedure and the sedation                            options and risks were discussed with the patient.                            All questions were answered, and informed consent                            was obtained. Prior Anticoagulants: The patient has                            taken no previous anticoagulant or antiplatelet                            agents. ASA Grade Assessment: II - A patient with                            mild systemic disease. After reviewing the risks                            and benefits, the patient was deemed in                            satisfactory condition to undergo the procedure.  After obtaining informed consent, the colonoscope                            was passed under direct vision. Throughout the                            procedure, the patient's blood pressure, pulse, and                            oxygen saturations were monitored continuously. The                            Colonoscope was introduced through the anus and                            advanced to the the cecum,  identified by                            appendiceal orifice and ileocecal valve. The                            ileocecal valve, appendiceal orifice, and rectum                            were photographed. The quality of the bowel                            preparation was excellent. The colonoscopy was                            performed without difficulty. The patient tolerated                            the procedure well. The bowel preparation used was                            SUPREP. Scope In: 10:42:39 AM Scope Out: 10:55:52 AM Scope Withdrawal Time: 0 hours 9 minutes 36 seconds  Total Procedure Duration: 0 hours 13 minutes 13 seconds  Findings:                 Multiple diverticula were found in the sigmoid                            colon.                           There was a nonbleeding AVM in the ascending colon.                            The exam was otherwise without abnormality on                            direct and retroflexion views. Complications:            No immediate complications.  Estimated blood loss:                            None. Estimated Blood Loss:     Estimated blood loss: none. Impression:               - Diverticulosis in the sigmoid colon.                           - Ascending colon AVM                           - The examination was otherwise normal on direct                            and retroflexion views.                           - No specimens collected. Recommendation:           - Repeat colonoscopy in 10 years for surveillance.                           - Patient has a contact number available for                            emergencies. The signs and symptoms of potential                            delayed complications were discussed with the                            patient. Return to normal activities tomorrow.                            Written discharge instructions were provided to the                            patient.                            - Resume previous diet.                           - Continue present medications. Docia Chuck. Henrene Pastor, MD 08/03/2018 11:04:24 AM This report has been signed electronically.

## 2018-08-06 ENCOUNTER — Telehealth: Payer: Self-pay

## 2018-08-06 DIAGNOSIS — M545 Low back pain: Secondary | ICD-10-CM | POA: Diagnosis not present

## 2018-08-06 DIAGNOSIS — M542 Cervicalgia: Secondary | ICD-10-CM | POA: Diagnosis not present

## 2018-08-06 NOTE — Telephone Encounter (Signed)
Name identifier, left a voicemail x 2.

## 2018-08-06 NOTE — Telephone Encounter (Signed)
Name identifier left a voicemail.

## 2018-08-08 ENCOUNTER — Encounter: Payer: Self-pay | Admitting: Internal Medicine

## 2018-08-27 DIAGNOSIS — R197 Diarrhea, unspecified: Secondary | ICD-10-CM | POA: Diagnosis not present

## 2018-09-04 DIAGNOSIS — M545 Low back pain: Secondary | ICD-10-CM | POA: Diagnosis not present

## 2018-09-04 DIAGNOSIS — M542 Cervicalgia: Secondary | ICD-10-CM | POA: Diagnosis not present

## 2018-09-17 ENCOUNTER — Telehealth: Payer: Self-pay | Admitting: Internal Medicine

## 2018-09-17 NOTE — Telephone Encounter (Signed)
Pt states she has been having diarrhea for 2 mths. States she was tested for cdiff 3 weeks ago and it was negative, PCP tried creon for 7 days that didn't help, she has also tried probiotics. Pt has not tried Imodium. Encouraged her to try Imodium and scheduled pt to see Alonza Bogus PA tomorrow at 1:30pm, pt aware of appt.

## 2018-09-17 NOTE — Telephone Encounter (Signed)
Patient states she has been having diarrhea for the past two months and wants some advice until her appt on 12.16.19. Pt was seen for procedure on 9.27.19.

## 2018-09-18 ENCOUNTER — Telehealth: Payer: Self-pay | Admitting: *Deleted

## 2018-09-18 ENCOUNTER — Ambulatory Visit (INDEPENDENT_AMBULATORY_CARE_PROVIDER_SITE_OTHER): Payer: BLUE CROSS/BLUE SHIELD | Admitting: Gastroenterology

## 2018-09-18 ENCOUNTER — Encounter (INDEPENDENT_AMBULATORY_CARE_PROVIDER_SITE_OTHER): Payer: Self-pay

## 2018-09-18 ENCOUNTER — Encounter: Payer: Self-pay | Admitting: Gastroenterology

## 2018-09-18 VITALS — BP 120/74 | HR 84 | Ht 65.75 in | Wt 174.5 lb

## 2018-09-18 DIAGNOSIS — R197 Diarrhea, unspecified: Secondary | ICD-10-CM

## 2018-09-18 MED ORDER — DIPHENOXYLATE-ATROPINE 2.5-0.025 MG PO TABS: ORAL_TABLET | ORAL | 1 refills | Status: DC

## 2018-09-18 MED ORDER — RIFAXIMIN 550 MG PO TABS: 550.0000 mg | ORAL_TABLET | Freq: Three times a day (TID) | ORAL | 0 refills | Status: AC

## 2018-09-18 NOTE — Patient Instructions (Signed)
We sent a prescripton for Lomotil and Xifaxan 550 mg to [facility].  If you are able to get the Xifaxan  550 mg, finish that and you can get a probiotic called Florastor at the pharmacy. It is over the counter. Take 1 tablet twice daily for a month.   Call us back about 1 week after you finish the Xifaxan with an update. Call us at [phone] and ask for [place].   Normal BMI (Body Mass Index- based on height and weight) is between 19 and 25. Your BMI today is Body mass index is 28.38 kg/m. Marland Kitchen Please consider follow up  regarding your BMI with your Primary Care Provider.

## 2018-09-18 NOTE — Progress Notes (Signed)
09/18/2018 Erica Potts [id number] [birth year]   HISTORY OF PRESENT ILLNESS:  This is a 62 year old female who is a patient of Dr. Blanch Media.  She is here today with complaints of diarrhea that has been present for the past 2 to 2-1/2 months.  Has never had any issues with diarrhea in the past.  Tells me that she did have C. difficile stool study that was negative.  This began after taking a course of antibiotics for bronchitis.  Since then she has had a normal colonoscopy (08/03/2018 showed diverticulosis and one non-bleeding AVM).  Diarrhea still did not resolve after bowel prep and colonoscopy procedure as she was hoping it would.  No improvement with probiotics.  Having diarrhea about 5 times per day, but sometimes more depending on what she eats.  Has been trying to eat bland.  Took Imodium a couple of times, but did not seem to help much.   Past Medical History:  Diagnosis Date  . Anxiety   . DDD (degenerative disc disease), lumbar   . Depression   . HLD (hyperlipidemia)   . HTN (hypertension)   . Insomnia   . Melanoma ([place]) 2000's  . Ovarian cyst, left   . Palpitations   . Spinal stenosis    Past Surgical History:  Procedure Laterality Date  . [place]   x 2  . COLONOSCOPY    . DILATION AND CURETTAGE OF UTERUS  2012  . LAPAROSCOPIC TOTAL HYSTERECTOMY  2013  . MELANOMA EXCISION    . MENISCUS REPAIR Left 2011  . MOUTH SURGERY Right    maxilary  . nipple biopsy    . OVARIAN CYST REMOVAL  1990  . POLYPECTOMY    . TUBAL LIGATION    . UPPER GASTROINTESTINAL ENDOSCOPY      reports that she quit smoking about 26 years ago. Her smoking use included cigarettes. She smoked 1.00 pack per day. She has never used smokeless tobacco. She reports that she drinks about 7.0 - 10.0 standard drinks of alcohol per week. She reports that she does not use drugs. family history includes Colon polyps in her father; Heart attack in her mother; Prostate cancer in her  father. No Known Allergies    Outpatient Encounter Medications as of 09/18/2018  Medication Sig  . ALPRAZolam (XANAX) 0.5 MG tablet Take 0.5 mg by mouth at bedtime as needed for sleep. Take 1/2 -1 tab at bedtime as needed  . aspirin EC 81 MG tablet Take 81 mg by mouth daily.  . cetirizine (ZYRTEC) 10 MG tablet Take 10 mg by mouth daily.  . Cholecalciferol (VITAMIN D) 1000 UNITS capsule Take 2,000 Units by mouth daily.   . cyclobenzaprine (FLEXERIL) 10 MG tablet Take 10 mg by mouth at bedtime.   . gabapentin (NEURONTIN) 300 MG capsule Take 600 mg by mouth at bedtime. Take two capsules by mouth at bedtime as needed   . HYDROcodone-acetaminophen (NORCO) 7.5-325 MG per tablet Take 1 tablet by mouth 4 (four) times daily as needed for pain.  . indomethacin (INDOCIN) 25 MG capsule Take 1 capsule by mouth as needed.  Marland Kitchen lisinopril (PRINIVIL,ZESTRIL) 10 MG tablet Take 10 mg by mouth daily.  Marland Kitchen loperamide (IMODIUM) 2 MG capsule Take 2 mg by mouth as needed for diarrhea or loose stools.  . meloxicam (MOBIC) 15 MG tablet Take 15 mg by mouth daily.  . Multiple Vitamin (MULTIVITAMIN) tablet Take 1 tablet by mouth daily.  . Omega-3  Fatty Acids (FISH OIL) 1000 MG CAPS Take 2 capsules by mouth daily.  Marland Kitchen omeprazole (PRILOSEC) 40 MG capsule TAKE ONE CAPSULE BY MOUTH EVERY DAY  . Probiotic CAPS Take 1 capsule by mouth daily.  Marland Kitchen zolpidem (AMBIEN) 10 MG tablet Take 5 mg by mouth at bedtime as needed for sleep.   Marland Kitchen docusate sodium (COLACE) 100 MG capsule Take 100 mg by mouth 2 (two) times daily.  . [DISCONTINUED] Estradiol (VIVELLE-DOT TD) Place 1 patch onto the skin. Apply patch two times weekly  . [DISCONTINUED] fluconazole (DIFLUCAN) 100 MG tablet Take 200mg  by mouth on day 1, then take 100mg  by mouth daily until completed.  . [DISCONTINUED] loratadine (CLARITIN) 10 MG tablet Take 10 mg by mouth daily.  . [DISCONTINUED] predniSONE (DELTASONE) 20 MG tablet Take 20 mg by mouth daily. Take 3 tabs daily for 2 days  then 2 tabs daily for 3 days then one tab daily for 7 days   No facility-administered encounter medications on file as of 09/18/2018.      REVIEW OF SYSTEMS  : All other systems reviewed and negative except where noted in the History of Present Illness.   PHYSICAL EXAM: BP 120/74 (BP Location: Left Arm, Patient Position: Sitting, Cuff Size: Normal)   Pulse 84   Ht 5' 5.75" (1.67 m)   Wt 174 lb 8 oz (79.2 kg)   BMI 28.38 kg/m  General: Well developed white female in no acute distress Head: Normocephalic and atraumatic Eyes:  Sclerae anicteric, conjunctiva pink. Ears: Normal auditory acuity Lungs: Clear throughout to auscultation; no increased WOB. Heart: Regular rate and rhythm; no M/R/G. Abdomen: Soft, non-distended.  BS present.  Non-tender. Musculoskeletal: Symmetrical with no gross deformities  Skin: No lesions on visible extremities Extremities: No edema  Neurological: Alert oriented x 4, grossly non-focal Psychological:  Alert and cooperative. Normal mood and affect  ASSESSMENT AND PLAN: *Diarrhea: Present for the past 2 to 2-1/2 months.  Has never had any issues with diarrhea in the past.  Tells me that she did have C. difficile stool study that was negative.  This began after taking a course of antibiotics for bronchitis.  Since then she has had a normal colonoscopy.  Diarrhea still did not resolve after bowel prep and colonoscopy procedure.  No improvement with probiotics.  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  We will try a course of Xifaxan 550 mg 3 times daily for 2 weeks.  Following that I requested that she start either Florastor or VSL #3 probiotic.  Will give a prescription for Lomotil to try in the interim as well.  She will call back in approximately 3 weeks to give [place] an update on her symptoms.   CC:  Leanna Battles, MD

## 2018-09-18 NOTE — Telephone Encounter (Signed)
Called [facility] [address] Her copy is zero for the Xifaxan 550 mg.  They only have part of the amount she needs. ( she needs 42 tablets).  They are ordering the rest of the tablets.  I called the patient to advise she can pick up enough to get started and pick up the remainder when it comes in.

## 2018-09-19 ENCOUNTER — Encounter: Payer: Self-pay | Admitting: Gastroenterology

## 2018-09-19 DIAGNOSIS — R197 Diarrhea, unspecified: Secondary | ICD-10-CM | POA: Insufficient documentation

## 2018-09-19 NOTE — Progress Notes (Signed)
Assessment and plan reviewed 

## 2018-09-26 ENCOUNTER — Telehealth: Payer: Self-pay | Admitting: Gastroenterology

## 2018-09-26 NOTE — Telephone Encounter (Signed)
The pt was seen for diarrhea and put on xifaxan and lomotil.  She states she has not been taking the xifaxan as directed and forgets to take all the doses.  She is taking lomotil twice daily as needed.  She states the diarrhea is some better and the stools are somewhat more formed.  She was advised to take the medications as prescribed and if she is no better after finishing the xifaxan she is to call back.  She should call back with an update in 1 week.

## 2018-10-01 DIAGNOSIS — M542 Cervicalgia: Secondary | ICD-10-CM | POA: Diagnosis not present

## 2018-10-01 DIAGNOSIS — M545 Low back pain: Secondary | ICD-10-CM | POA: Diagnosis not present

## 2018-10-11 ENCOUNTER — Telehealth: Payer: Self-pay | Admitting: Gastroenterology

## 2018-10-11 DIAGNOSIS — R197 Diarrhea, unspecified: Secondary | ICD-10-CM

## 2018-10-11 NOTE — Telephone Encounter (Signed)
Left message on machine to call back  

## 2018-10-11 NOTE — Telephone Encounter (Signed)
The pt called with continued diarrhea although it has lessened in frequency she still has several watery stools daily.  She did begin to take the xifaxan as prescribed as well as florastor 2 pills twice daily.  She is also taking imodium 1 every morning and 1 at night.  Lomotil as needed. She began to get tearful stating she is exhausted.  Has a f/u with dr Henrene Pastor on 12/16 but says she has to have something done before then.  Please advise

## 2018-10-12 NOTE — Telephone Encounter (Signed)
Please check stool for Cdiff and GI pathogen panel.  She reports that she had a negative Cdiff but we do not have the actual results and with this ongoing diarrhea I just want them rechecked.  Also, have her discontinue the Imodium and use Lomotil 2 tabs up to 4 times daily.  Keep her appt with Dr. Henrene Pastor on 12/16.  Thank you,  Jess

## 2018-10-12 NOTE — Telephone Encounter (Signed)
Left message on machine to call back  

## 2018-10-15 NOTE — Telephone Encounter (Signed)
Left message on machine to call back  

## 2018-10-16 NOTE — Telephone Encounter (Signed)
Left message on machine to call back unable to reach pt will mail letter  

## 2018-10-22 ENCOUNTER — Encounter: Payer: Self-pay | Admitting: Internal Medicine

## 2018-10-22 ENCOUNTER — Ambulatory Visit (INDEPENDENT_AMBULATORY_CARE_PROVIDER_SITE_OTHER): Payer: BLUE CROSS/BLUE SHIELD | Admitting: Internal Medicine

## 2018-10-22 VITALS — BP 132/80 | HR 74 | Ht 66.0 in | Wt 178.5 lb

## 2018-10-22 DIAGNOSIS — R197 Diarrhea, unspecified: Secondary | ICD-10-CM | POA: Diagnosis not present

## 2018-10-22 DIAGNOSIS — B3781 Candidal esophagitis: Secondary | ICD-10-CM | POA: Diagnosis not present

## 2018-10-22 DIAGNOSIS — M545 Low back pain: Secondary | ICD-10-CM | POA: Diagnosis not present

## 2018-10-22 DIAGNOSIS — M542 Cervicalgia: Secondary | ICD-10-CM | POA: Diagnosis not present

## 2018-10-22 NOTE — Patient Instructions (Signed)
Please follow up as needed 

## 2018-10-22 NOTE — Progress Notes (Signed)
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  Patient's problems began late summer after being exposed to antibiotics.  I actually saw her August 03, 2018 for surveillance colonoscopy.  She did not mention diarrhea at that time.  She was found to have sigmoid diverticulosis and ascending colon AVM.  No neoplasia.  Follow-up in 10 years recommended.  Upper endoscopy at the same time was performed regarding dyspepsia and follow-up of previous acute gastric ulcer.  She was found to have esophageal candidiasis and benign hyperplastic gastric polyp.  No ulcer disease.  She was prescribed Diflucan.  She subsequently saw the GI physician assistant September 18, 2018 regarding her persistent diarrhea.  See that dictation for details.  She was treated with Xifaxan, Florastor, and Lomotil.  Stool studies elsewhere were negative for C. difficile.  Patient was given this appointment for follow-up.  She is pleased to report that she is doing better.  Previously 6 or 8 watery bowel movements per day and occasionally at night.  Currently with about 3 soft formed bowel movements daily.  No urgency or cramping.  She continues on [employer].  Has been using Imodium once or twice daily.  Otherwise well.  REVIEW OF SYSTEMS:  All non-GI ROS negative less otherwise stated in the HPI except for chronic arthritis, chronic back pain, fatigue, sleeping problems  Past Medical History:  Diagnosis Date  . Anxiety   . DDD (degenerative disc disease), lumbar   . Depression   . HLD (hyperlipidemia)   . HTN (hypertension)   . Insomnia   . Melanoma ([place]) 2000's  . Ovarian cyst, left   . Palpitations   . Spinal stenosis     Past Surgical History:  Procedure Laterality Date  . [place]   x 2  . COLONOSCOPY    . DILATION AND CURETTAGE OF UTERUS  2012  . LAPAROSCOPIC TOTAL HYSTERECTOMY  2013  .  MELANOMA EXCISION    . MENISCUS REPAIR Left 2011  . MOUTH SURGERY Right    maxilary  . nipple biopsy    . OVARIAN CYST REMOVAL  1990  . POLYPECTOMY    . TUBAL LIGATION    . UPPER GASTROINTESTINAL ENDOSCOPY      Social History Erica Potts  reports that she quit smoking about 26 years ago. Her smoking use included cigarettes. She smoked 1.00 pack per day. She has never used smokeless tobacco. She reports current alcohol use of about 7.0 - 10.0 standard drinks of alcohol per week. She reports that she does not use drugs.  family history includes Colon polyps in her father; Heart attack in her mother; Prostate cancer in her father.  No Known Allergies     PHYSICAL EXAMINATION: Vital signs: BP 132/80   Pulse 74   Ht 5\' 6"  (1.676 m)   Wt 178 lb 8 oz (81 kg)   BMI 28.81 kg/m   Constitutional: generally well-appearing, no acute distress Psychiatric: alert and oriented x3, cooperative Eyes: extraocular movements intact, anicteric, conjunctiva pink Mouth: oral pharynx moist, no lesions Neck: supple no lymphadenopathy Cardiovascular: heart regular rate and rhythm, no murmur Lungs: clear to auscultation bilaterally Abdomen: soft, nontender, nondistended, no obvious ascites, no peritoneal signs, normal bowel sounds, no organomegaly Rectal: Mid Extremities: no lower extremity edema bilaterally Skin: no lesions on visible extremities Neuro: No focal deficits.  Backspace  ASSESSMENT:  1.  Recent problems with  diarrhea.  Likely viral gastroenteritis with postinfectious motility disturbance which is improving.  Could be related to altered bacterial flora from antibiotic exposure.  Again, improving.  Colonoscopy in September was unremarkable 2.  Candida esophagitis.  Likely related to antibiotic exposure.  Treated 3.  General medical problems   PLAN:  1.  Discussed diarrhea differential diagnosis and excellent prognosis 2.  Continue probiotic for an additional 4 weeks 3.  May  use Imodium as needed 4.  Routine screening colonoscopy 10 years 5.  Interval GI follow-up as needed

## 2018-11-12 DIAGNOSIS — D72829 Elevated white blood cell count, unspecified: Secondary | ICD-10-CM | POA: Diagnosis not present

## 2018-11-12 DIAGNOSIS — R52 Pain, unspecified: Secondary | ICD-10-CM | POA: Diagnosis not present

## 2018-11-12 DIAGNOSIS — R634 Abnormal weight loss: Secondary | ICD-10-CM | POA: Diagnosis not present

## 2018-11-12 DIAGNOSIS — I10 Essential (primary) hypertension: Secondary | ICD-10-CM | POA: Diagnosis not present

## 2018-11-12 DIAGNOSIS — R197 Diarrhea, unspecified: Secondary | ICD-10-CM | POA: Diagnosis not present

## 2018-11-13 DIAGNOSIS — R197 Diarrhea, unspecified: Secondary | ICD-10-CM | POA: Diagnosis not present

## 2018-11-16 DIAGNOSIS — R634 Abnormal weight loss: Secondary | ICD-10-CM | POA: Diagnosis not present

## 2018-11-26 DIAGNOSIS — M545 Low back pain: Secondary | ICD-10-CM | POA: Diagnosis not present

## 2018-11-26 DIAGNOSIS — M542 Cervicalgia: Secondary | ICD-10-CM | POA: Diagnosis not present

## 2018-11-30 DIAGNOSIS — D225 Melanocytic nevi of trunk: Secondary | ICD-10-CM | POA: Diagnosis not present

## 2018-11-30 DIAGNOSIS — L812 Freckles: Secondary | ICD-10-CM | POA: Diagnosis not present

## 2018-11-30 DIAGNOSIS — L821 Other seborrheic keratosis: Secondary | ICD-10-CM | POA: Diagnosis not present

## 2018-11-30 DIAGNOSIS — Z85820 Personal history of malignant melanoma of skin: Secondary | ICD-10-CM | POA: Diagnosis not present

## 2018-12-31 DIAGNOSIS — M542 Cervicalgia: Secondary | ICD-10-CM | POA: Diagnosis not present

## 2018-12-31 DIAGNOSIS — M545 Low back pain: Secondary | ICD-10-CM | POA: Diagnosis not present

## 2019-01-18 DIAGNOSIS — I10 Essential (primary) hypertension: Secondary | ICD-10-CM | POA: Diagnosis not present

## 2019-01-18 DIAGNOSIS — R82998 Other abnormal findings in urine: Secondary | ICD-10-CM | POA: Diagnosis not present

## 2019-01-18 DIAGNOSIS — E7849 Other hyperlipidemia: Secondary | ICD-10-CM | POA: Diagnosis not present

## 2019-01-18 DIAGNOSIS — Z0000 Encounter for general adult medical examination without abnormal findings: Secondary | ICD-10-CM | POA: Diagnosis not present

## 2019-01-22 DIAGNOSIS — Z1331 Encounter for screening for depression: Secondary | ICD-10-CM | POA: Diagnosis not present

## 2019-01-22 DIAGNOSIS — M545 Low back pain: Secondary | ICD-10-CM | POA: Diagnosis not present

## 2019-01-22 DIAGNOSIS — E7849 Other hyperlipidemia: Secondary | ICD-10-CM | POA: Diagnosis not present

## 2019-01-22 DIAGNOSIS — Z0000 Encounter for general adult medical examination without abnormal findings: Secondary | ICD-10-CM | POA: Diagnosis not present

## 2019-01-22 DIAGNOSIS — I10 Essential (primary) hypertension: Secondary | ICD-10-CM | POA: Diagnosis not present

## 2019-01-29 DIAGNOSIS — Z1212 Encounter for screening for malignant neoplasm of rectum: Secondary | ICD-10-CM | POA: Diagnosis not present

## 2019-03-04 DIAGNOSIS — M545 Low back pain: Secondary | ICD-10-CM | POA: Diagnosis not present

## 2019-03-04 DIAGNOSIS — M542 Cervicalgia: Secondary | ICD-10-CM | POA: Diagnosis not present

## 2019-03-26 DIAGNOSIS — M545 Low back pain: Secondary | ICD-10-CM | POA: Diagnosis not present

## 2019-03-26 DIAGNOSIS — M542 Cervicalgia: Secondary | ICD-10-CM | POA: Diagnosis not present

## 2019-04-16 DIAGNOSIS — M542 Cervicalgia: Secondary | ICD-10-CM | POA: Diagnosis not present

## 2019-04-16 DIAGNOSIS — M545 Low back pain: Secondary | ICD-10-CM | POA: Diagnosis not present

## 2019-05-07 DIAGNOSIS — M542 Cervicalgia: Secondary | ICD-10-CM | POA: Diagnosis not present

## 2019-05-07 DIAGNOSIS — M545 Low back pain: Secondary | ICD-10-CM | POA: Diagnosis not present

## 2019-05-27 DIAGNOSIS — M542 Cervicalgia: Secondary | ICD-10-CM | POA: Diagnosis not present

## 2019-05-27 DIAGNOSIS — M545 Low back pain: Secondary | ICD-10-CM | POA: Diagnosis not present

## 2019-06-17 DIAGNOSIS — M545 Low back pain: Secondary | ICD-10-CM | POA: Diagnosis not present

## 2019-06-17 DIAGNOSIS — M542 Cervicalgia: Secondary | ICD-10-CM | POA: Diagnosis not present

## 2019-07-01 DIAGNOSIS — Z01419 Encounter for gynecological examination (general) (routine) without abnormal findings: Secondary | ICD-10-CM | POA: Diagnosis not present

## 2019-07-01 DIAGNOSIS — Z6829 Body mass index (BMI) 29.0-29.9, adult: Secondary | ICD-10-CM | POA: Diagnosis not present

## 2019-07-08 DIAGNOSIS — M545 Low back pain: Secondary | ICD-10-CM | POA: Diagnosis not present

## 2019-07-08 DIAGNOSIS — M542 Cervicalgia: Secondary | ICD-10-CM | POA: Diagnosis not present

## 2019-07-09 ENCOUNTER — Other Ambulatory Visit: Payer: Self-pay

## 2019-07-09 DIAGNOSIS — Z20822 Contact with and (suspected) exposure to covid-19: Secondary | ICD-10-CM

## 2019-07-09 DIAGNOSIS — R6889 Other general symptoms and signs: Secondary | ICD-10-CM | POA: Diagnosis not present

## 2019-07-11 LAB — NOVEL CORONAVIRUS, NAA: SARS-CoV-2, NAA: NOT DETECTED

## 2019-07-20 DIAGNOSIS — Z23 Encounter for immunization: Secondary | ICD-10-CM | POA: Diagnosis not present

## 2019-07-22 DIAGNOSIS — M545 Low back pain: Secondary | ICD-10-CM | POA: Diagnosis not present

## 2019-07-22 DIAGNOSIS — I10 Essential (primary) hypertension: Secondary | ICD-10-CM | POA: Diagnosis not present

## 2019-08-05 DIAGNOSIS — M545 Low back pain: Secondary | ICD-10-CM | POA: Diagnosis not present

## 2019-08-05 DIAGNOSIS — M542 Cervicalgia: Secondary | ICD-10-CM | POA: Diagnosis not present

## 2019-08-09 ENCOUNTER — Other Ambulatory Visit: Payer: Self-pay | Admitting: Internal Medicine

## 2019-08-09 DIAGNOSIS — Z1231 Encounter for screening mammogram for malignant neoplasm of breast: Secondary | ICD-10-CM

## 2019-08-26 DIAGNOSIS — M542 Cervicalgia: Secondary | ICD-10-CM | POA: Diagnosis not present

## 2019-08-26 DIAGNOSIS — M545 Low back pain: Secondary | ICD-10-CM | POA: Diagnosis not present

## 2019-09-10 DIAGNOSIS — L218 Other seborrheic dermatitis: Secondary | ICD-10-CM | POA: Diagnosis not present

## 2019-09-23 DIAGNOSIS — M542 Cervicalgia: Secondary | ICD-10-CM | POA: Diagnosis not present

## 2019-09-23 DIAGNOSIS — M545 Low back pain: Secondary | ICD-10-CM | POA: Diagnosis not present

## 2019-09-25 ENCOUNTER — Other Ambulatory Visit: Payer: Self-pay

## 2019-09-25 DIAGNOSIS — Z20822 Contact with and (suspected) exposure to covid-19: Secondary | ICD-10-CM

## 2019-09-27 LAB — NOVEL CORONAVIRUS, NAA: SARS-CoV-2, NAA: NOT DETECTED

## 2019-10-21 DIAGNOSIS — M542 Cervicalgia: Secondary | ICD-10-CM | POA: Diagnosis not present

## 2019-10-21 DIAGNOSIS — M545 Low back pain: Secondary | ICD-10-CM | POA: Diagnosis not present

## 2019-10-28 ENCOUNTER — Other Ambulatory Visit: Payer: Self-pay

## 2019-10-28 ENCOUNTER — Ambulatory Visit
Admission: RE | Admit: 2019-10-28 | Discharge: 2019-10-28 | Disposition: A | Discharge status: Home / Self Care | Payer: BLUE CROSS/BLUE SHIELD | Source: Ambulatory Visit | Attending: Internal Medicine | Admitting: Internal Medicine

## 2019-10-28 DIAGNOSIS — Z1231 Encounter for screening mammogram for malignant neoplasm of breast: Secondary | ICD-10-CM | POA: Diagnosis not present

## 2019-11-22 ENCOUNTER — Other Ambulatory Visit: Payer: BC Managed Care – PPO

## 2019-11-25 ENCOUNTER — Ambulatory Visit: Payer: BC Managed Care – PPO | Attending: Internal Medicine

## 2019-11-25 DIAGNOSIS — Z20822 Contact with and (suspected) exposure to covid-19: Secondary | ICD-10-CM | POA: Diagnosis not present

## 2019-11-26 LAB — NOVEL CORONAVIRUS, NAA: SARS-CoV-2, NAA: DETECTED — AB

## 2019-11-27 ENCOUNTER — Telehealth: Payer: Self-pay | Admitting: Nurse Practitioner

## 2019-11-27 NOTE — Telephone Encounter (Signed)
Called to discuss with Erica Potts about Covid symptoms and the use of bamlanivimab, a monoclonal antibody infusion for those with mild to moderate Covid symptoms and at a high risk of hospitalization.    Pt does not qualify for infusion therapy as she has asymptomatic infection. Isolation precautions discussed. Advised to contact back for consideration should she develop symptoms. Patient verbalized understanding.     Patient Active Problem List   Diagnosis Date Noted  . Diarrhea 09/19/2018    Beckey Rutter, DNP, AGNP-C [facility]  [email or web address] [facility] Main Line: [phone]

## 2019-12-10 DIAGNOSIS — M542 Cervicalgia: Secondary | ICD-10-CM | POA: Diagnosis not present

## 2019-12-10 DIAGNOSIS — M545 Low back pain: Secondary | ICD-10-CM | POA: Diagnosis not present

## 2019-12-11 DIAGNOSIS — L570 Actinic keratosis: Secondary | ICD-10-CM | POA: Diagnosis not present

## 2019-12-11 DIAGNOSIS — D225 Melanocytic nevi of trunk: Secondary | ICD-10-CM | POA: Diagnosis not present

## 2019-12-11 DIAGNOSIS — D2262 Melanocytic nevi of left upper limb, including shoulder: Secondary | ICD-10-CM | POA: Diagnosis not present

## 2019-12-11 DIAGNOSIS — L218 Other seborrheic dermatitis: Secondary | ICD-10-CM | POA: Diagnosis not present

## 2019-12-11 DIAGNOSIS — D2261 Melanocytic nevi of right upper limb, including shoulder: Secondary | ICD-10-CM | POA: Diagnosis not present

## 2019-12-31 DIAGNOSIS — M545 Low back pain: Secondary | ICD-10-CM | POA: Diagnosis not present

## 2019-12-31 DIAGNOSIS — M542 Cervicalgia: Secondary | ICD-10-CM | POA: Diagnosis not present

## 2020-01-17 DIAGNOSIS — M542 Cervicalgia: Secondary | ICD-10-CM | POA: Diagnosis not present

## 2020-01-17 DIAGNOSIS — M545 Low back pain: Secondary | ICD-10-CM | POA: Diagnosis not present

## 2020-01-21 DIAGNOSIS — Z0000 Encounter for general adult medical examination without abnormal findings: Secondary | ICD-10-CM | POA: Diagnosis not present

## 2020-01-21 DIAGNOSIS — E7849 Other hyperlipidemia: Secondary | ICD-10-CM | POA: Diagnosis not present

## 2020-01-28 DIAGNOSIS — I10 Essential (primary) hypertension: Secondary | ICD-10-CM | POA: Diagnosis not present

## 2020-01-28 DIAGNOSIS — F4321 Adjustment disorder with depressed mood: Secondary | ICD-10-CM | POA: Diagnosis not present

## 2020-01-28 DIAGNOSIS — Z1331 Encounter for screening for depression: Secondary | ICD-10-CM | POA: Diagnosis not present

## 2020-01-28 DIAGNOSIS — E785 Hyperlipidemia, unspecified: Secondary | ICD-10-CM | POA: Diagnosis not present

## 2020-01-28 DIAGNOSIS — R82998 Other abnormal findings in urine: Secondary | ICD-10-CM | POA: Diagnosis not present

## 2020-01-28 DIAGNOSIS — M545 Low back pain: Secondary | ICD-10-CM | POA: Diagnosis not present

## 2020-01-28 DIAGNOSIS — Z0000 Encounter for general adult medical examination without abnormal findings: Secondary | ICD-10-CM | POA: Diagnosis not present

## 2020-01-28 DIAGNOSIS — M542 Cervicalgia: Secondary | ICD-10-CM | POA: Diagnosis not present

## 2020-01-31 DIAGNOSIS — Z1212 Encounter for screening for malignant neoplasm of rectum: Secondary | ICD-10-CM | POA: Diagnosis not present

## 2020-02-11 ENCOUNTER — Other Ambulatory Visit: Payer: Self-pay

## 2020-02-11 ENCOUNTER — Emergency Department (HOSPITAL_COMMUNITY)
Admission: EM | Admit: 2020-02-11 | Discharge: 2020-02-11 | Disposition: A | Discharge status: Home / Self Care | Payer: BC Managed Care – PPO | Attending: Emergency Medicine | Admitting: Emergency Medicine

## 2020-02-11 ENCOUNTER — Emergency Department (HOSPITAL_COMMUNITY): Payer: BC Managed Care – PPO

## 2020-02-11 ENCOUNTER — Telehealth: Payer: Self-pay | Admitting: Internal Medicine

## 2020-02-11 ENCOUNTER — Encounter (HOSPITAL_COMMUNITY): Payer: Self-pay | Admitting: *Deleted

## 2020-02-11 DIAGNOSIS — I10 Essential (primary) hypertension: Secondary | ICD-10-CM | POA: Diagnosis not present

## 2020-02-11 DIAGNOSIS — Z87891 Personal history of nicotine dependence: Secondary | ICD-10-CM | POA: Diagnosis not present

## 2020-02-11 DIAGNOSIS — Z79899 Other long term (current) drug therapy: Secondary | ICD-10-CM | POA: Insufficient documentation

## 2020-02-11 DIAGNOSIS — K529 Noninfective gastroenteritis and colitis, unspecified: Secondary | ICD-10-CM | POA: Diagnosis not present

## 2020-02-11 DIAGNOSIS — R109 Unspecified abdominal pain: Secondary | ICD-10-CM | POA: Diagnosis not present

## 2020-02-11 DIAGNOSIS — R1084 Generalized abdominal pain: Secondary | ICD-10-CM | POA: Diagnosis not present

## 2020-02-11 DIAGNOSIS — Z7982 Long term (current) use of aspirin: Secondary | ICD-10-CM | POA: Insufficient documentation

## 2020-02-11 DIAGNOSIS — Z85820 Personal history of malignant melanoma of skin: Secondary | ICD-10-CM | POA: Insufficient documentation

## 2020-02-11 LAB — COMPREHENSIVE METABOLIC PANEL
ALT: 19 U/L (ref 0–44)
AST: 23 U/L (ref 15–41)
Albumin: 4.3 g/dL (ref 3.5–5.0)
Alkaline Phosphatase: 43 U/L (ref 38–126)
Anion gap: 7 (ref 5–15)
BUN: 7 mg/dL — ABNORMAL LOW (ref 8–23)
CO2: 25 mmol/L (ref 22–32)
Calcium: 8.7 mg/dL — ABNORMAL LOW (ref 8.9–10.3)
Chloride: 104 mmol/L (ref 98–111)
Creatinine, Ser: 0.72 mg/dL (ref 0.44–1.00)
GFR calc Af Amer: >60 mL/min (ref >60)
GFR calc non Af Amer: >60 mL/min (ref >60)
Glucose, Bld: 89 mg/dL (ref 70–99)
Potassium: 3.4 mmol/L — ABNORMAL LOW (ref 3.5–5.1)
Sodium: 136 mmol/L (ref 135–145)
Total Bilirubin: 0.9 mg/dL (ref 0.3–1.2)
Total Protein: 6.9 g/dL (ref 6.5–8.1)

## 2020-02-11 LAB — URINALYSIS, ROUTINE W REFLEX MICROSCOPIC
Bilirubin Urine: NEGATIVE
Glucose, UA: NEGATIVE mg/dL
Hgb urine dipstick: NEGATIVE
Ketones, ur: 80 mg/dL — AB
Leukocytes,Ua: NEGATIVE
Nitrite: NEGATIVE
Protein, ur: NEGATIVE mg/dL
Specific Gravity, Urine: 1.01 (ref 1.005–1.030)
pH: 6 (ref 5.0–8.0)

## 2020-02-11 LAB — CBC
HCT: 38.9 % (ref 36.0–46.0)
Hemoglobin: 12.5 g/dL (ref 12.0–15.0)
MCH: 29.8 pg (ref 26.0–34.0)
MCHC: 32.1 g/dL (ref 30.0–36.0)
MCV: 92.6 fL (ref 80.0–100.0)
Platelets: 451 10*3/uL — ABNORMAL HIGH (ref 150–400)
RBC: 4.2 MIL/uL (ref 3.87–5.11)
RDW: 16.2 % — ABNORMAL HIGH (ref 11.5–15.5)
WBC: 15 10*3/uL — ABNORMAL HIGH (ref 4.0–10.5)
nRBC: 0 % (ref 0.0–0.2)

## 2020-02-11 LAB — ABO/RH: ABO/RH(D): O POS

## 2020-02-11 LAB — LIPASE, BLOOD: Lipase: 22 U/L (ref 11–51)

## 2020-02-11 LAB — TYPE AND SCREEN
ABO/RH(D): O POS
Antibody Screen: NEGATIVE

## 2020-02-11 MED ORDER — SODIUM CHLORIDE 0.9% FLUSH: 3.0000 mL | Freq: Once | INTRAVENOUS | Status: DC

## 2020-02-11 MED ORDER — ONDANSETRON HCL 4 MG PO TABS: 4.0000 mg | ORAL_TABLET | Freq: Every day | ORAL | 1 refills | Status: AC | PRN

## 2020-02-11 MED ORDER — IOHEXOL 300 MG/ML  SOLN
100.0000 mL | Freq: Once | INTRAMUSCULAR | Status: AC | PRN
  Administered 2020-02-11: 100 mL via INTRAVENOUS

## 2020-02-11 MED ORDER — SODIUM CHLORIDE (PF) 0.9 % IJ SOLN
INTRAMUSCULAR | Status: AC
  Filled 2020-02-11: qty 50

## 2020-02-11 MED ORDER — METRONIDAZOLE 500 MG PO TABS: 500.0000 mg | ORAL_TABLET | Freq: Two times a day (BID) | ORAL | 0 refills | Status: AC

## 2020-02-11 MED ORDER — CIPROFLOXACIN HCL 500 MG PO TABS: 500.0000 mg | ORAL_TABLET | Freq: Two times a day (BID) | ORAL | 0 refills | Status: AC

## 2020-02-11 NOTE — Telephone Encounter (Signed)
Pt states she is taking hydrocodone so she takes colace every night. Saturday night she had a large amount of hard stool along with severe cramping to where she was almost in tears, broke out in a cold sweat. She took more colace because she felt she was not empty. Sunday she had more cramping and states the discomfort was 8-9 on a scale of 1-10. Yesterday she had a BM and there was BRB in the toilet. This am at 6 she had a BM and there was BRB in the stool, at 7:30am she states all she passed was about 1/2 cup BRB. Please advise.

## 2020-02-11 NOTE — ED Notes (Signed)
Pt transported to CT ?

## 2020-02-11 NOTE — ED Provider Notes (Addendum)
Erica Potts Provider Note   CSN: [id number] Arrival date & time: 02/11/20  1208     History Chief Complaint  Patient presents with  . Abdominal Pain  . Rectal Bleeding    Erica Potts is a 64 y.o. female.  The history is provided by the patient. No language interpreter was used.  Abdominal Pain Pain location:  Generalized Pain quality: aching   Pain radiates to:  Does not radiate Pain severity:  Moderate Onset quality:  Gradual Timing:  Constant Progression:  Worsening Chronicity:  New Context: not alcohol use and not sick contacts   Relieved by:  Nothing Worsened by:  Nothing Ineffective treatments:  None tried Associated symptoms: diarrhea, hematochezia and nausea   Associated symptoms: no fever   Rectal Bleeding Associated symptoms: abdominal pain   Associated symptoms: no fever   Pt reports she has had 3 large bowel movements and lower abdominal pain. Pt has a history of diverticulosis on colonoscopy     Past Medical History:  Diagnosis Date  . Anxiety   . DDD (degenerative disc disease), lumbar   . Depression   . HLD (hyperlipidemia)   . HTN (hypertension)   . Insomnia   . Melanoma ([place]) 2000's  . Ovarian cyst, left   . Palpitations   . Spinal stenosis     Patient Active Problem List   Diagnosis Date Noted  . Diarrhea 09/19/2018    Past Surgical History:  Procedure Laterality Date  . BREAST SURGERY    . [place]   x 2  . COLONOSCOPY    . DILATION AND CURETTAGE OF UTERUS  2012  . LAPAROSCOPIC TOTAL HYSTERECTOMY  2013  . MELANOMA EXCISION    . MENISCUS REPAIR Left 2011  . MOUTH SURGERY Right    maxilary  . nipple biopsy    . OVARIAN CYST REMOVAL  1990  . POLYPECTOMY    . TUBAL LIGATION    . UPPER GASTROINTESTINAL ENDOSCOPY       OB History   No obstetric history on file.     Family History  Problem Relation Age of Onset  . Heart attack Mother   . Prostate cancer  Father   . Colon polyps Father   . Colon cancer Neg Hx   . Esophageal cancer Neg Hx   . Rectal cancer Neg Hx   . Stomach cancer Neg Hx     Social History   Tobacco Use  . Smoking status: Former Smoker    Packs/day: 1.00    Types: Cigarettes    Quit date: 11/17/1991    Years since quitting: 28.2  . Smokeless tobacco: Never Used  Substance Use Topics  . Alcohol use: Yes    Alcohol/week: 7.0 - 10.0 standard drinks    Types: 7 - 10 Cans of beer per week  . Drug use: No    Home Medications Prior to Admission medications   Medication Sig Start Date End Date Taking? Authorizing Provider  ALPRAZolam Duanne Moron) 0.5 MG tablet Take 0.5 mg by mouth at bedtime as needed for sleep. Take 1/2 -1 tab at bedtime as needed    [provider]  aspirin EC 81 MG tablet Take 81 mg by mouth daily.    [provider]  cetirizine (ZYRTEC) 10 MG tablet Take 10 mg by mouth daily.    [provider]  Cholecalciferol (VITAMIN D) 1000 UNITS capsule Take 2,000 Units by mouth daily.  [provider]  ciprofloxacin (CIPRO) 500 MG tablet Take 1 tablet (500 mg total) by mouth 2 (two) times daily for 10 days. 02/11/20 02/21/20  Fransico Meadow, PA-C  cyclobenzaprine (FLEXERIL) 10 MG tablet Take 10 mg by mouth at bedtime.     [provider]  diphenoxylate-atropine (LOMOTIL) 2.5-0.025 MG tablet Take 1 tablet by mouth as needed for diarrhea. 09/18/18   Zehr, Laban Emperor, PA-C  docusate sodium (COLACE) 100 MG capsule Take 100 mg by mouth 2 (two) times daily.    [provider]  gabapentin (NEURONTIN) 300 MG capsule Take 600 mg by mouth at bedtime. Take two capsules by mouth at bedtime as needed     [provider]  HYDROcodone-acetaminophen (NORCO) 7.5-325 MG per tablet Take 1 tablet by mouth 4 (four) times daily as needed for pain.    [provider]  indomethacin (INDOCIN) 25 MG capsule Take 1 capsule by mouth as needed. 06/30/18   [provider]  lisinopril (PRINIVIL,ZESTRIL) 10 MG tablet Take 10 mg by mouth daily.    [provider]  meloxicam (MOBIC) 15 MG tablet Take 15 mg by mouth daily.    [provider]  metroNIDAZOLE (FLAGYL) 500 MG tablet Take 1 tablet (500 mg total) by mouth 2 (two) times daily for 10 days. 02/11/20 02/21/20  Fransico Meadow, PA-C  Multiple Vitamin (MULTIVITAMIN) tablet Take 1 tablet by mouth daily.    [provider]  Omega-3 Fatty Acids (FISH OIL) 1000 MG CAPS Take 2 capsules by mouth daily.    [provider]  omeprazole (PRILOSEC) 40 MG capsule TAKE ONE CAPSULE BY MOUTH EVERY DAY 03/16/15   Irene Shipper, MD  ondansetron (ZOFRAN) 4 MG tablet Take 1 tablet (4 mg total) by mouth daily as needed for nausea or vomiting. 02/11/20 02/10/21  Fransico Meadow, PA-C  saccharomyces boulardii (FLORASTOR) 250 MG capsule Take 250 mg by mouth 2 (two) times daily.    [provider]  zolpidem (AMBIEN) 10 MG tablet Take 5 mg by mouth at bedtime as needed for sleep.     [provider]    Allergies    Patient has no known allergies.  Review of Systems   Review of Systems  Constitutional: Negative for fever.  Gastrointestinal: Positive for abdominal pain, diarrhea, hematochezia and nausea.  All other systems reviewed and are negative.   Physical Exam Updated Vital Signs BP 138/74   Pulse 84   Temp 98.3 F (36.8 C) (Oral)   Resp 16   Ht 5\' 6"  (1.676 m)   Wt 77.1 kg   SpO2 97%   BMI 27.44 kg/m   Physical Exam Vitals and nursing note reviewed.  Constitutional:      Appearance: She is well-developed.  HENT:     Head: Normocephalic.  Eyes:     Extraocular Movements: Extraocular movements intact.  Cardiovascular:     Rate and Rhythm: Normal rate and regular rhythm.     Heart sounds: Normal heart sounds.  Pulmonary:     Effort: Pulmonary effort is normal.  Abdominal:     General: Abdomen is flat. Bowel sounds are normal. There is no distension.      Palpations: Abdomen is soft.     Tenderness: There is generalized abdominal tenderness. There is no rebound.     Hernia: No hernia is present.  Musculoskeletal:        General: Normal range of motion.     Cervical back: Normal  range of motion.  Skin:    General: Skin is warm.  Neurological:     Mental Status: She is alert and oriented to person, place, and time.     ED Results / Procedures / Treatments   Labs (all labs ordered are listed, but only abnormal results are displayed) Labs Reviewed  COMPREHENSIVE METABOLIC PANEL - Abnormal; Notable for the following components:      Result Value   Potassium 3.4 (*)    BUN 7 (*)    Calcium 8.7 (*)    All other components within normal limits  CBC - Abnormal; Notable for the following components:   WBC 15.0 (*)    RDW 16.2 (*)    Platelets 451 (*)    All other components within normal limits  URINALYSIS, ROUTINE W REFLEX MICROSCOPIC - Abnormal; Notable for the following components:   Ketones, ur 80 (*)    All other components within normal limits  LIPASE, BLOOD  TYPE AND SCREEN  ABO/RH    EKG None  Radiology CT ABDOMEN PELVIS W CONTRAST  Result Date: 02/11/2020 CLINICAL DATA:  Acute nonlocalized abdominal pain. Abdominal cramping and nausea. Blood in stool. EXAM: CT ABDOMEN AND PELVIS WITH CONTRAST TECHNIQUE: Multidetector CT imaging of the abdomen and pelvis was performed using the standard protocol following bolus administration of intravenous contrast. CONTRAST:  130mL OMNIPAQUE IOHEXOL 300 MG/ML  SOLN COMPARISON:  None. FINDINGS: Lower chest: Lung bases are clear. Hepatobiliary: Mild diffusely decreased hepatic density consistent with steatosis. Scattered subcentimeter low-density lesions throughout the right and left lobe of the liver are too small to accurately characterize but likely cysts, hemangiomas, or hamartomas. No suspicious lesion. Gallbladder physiologically distended, no calcified stone. No biliary dilatation.  Pancreas: No ductal dilatation or inflammation. Spleen: Normal in size without focal abnormality. Adrenals/Urinary Tract: Normal adrenal glands. No hydronephrosis or perinephric edema. Homogeneous renal enhancement with symmetric excretion on delayed phase imaging. Urinary bladder is physiologically distended without wall thickening. Stomach/Bowel: Nondistended stomach. Normal positioning of the ligament of Treitz. No small bowel obstruction or inflammatory change. Slight increased small bowel air is likely reactive. Normal appendix. There is colonic wall thickening involving the proximal through distal descending colon with pericolonic fat stranding. Few scattered colonic diverticula in this region but no focally inflamed diverticulum. Distal colon is decompressed with mild sigmoid colonic diverticula. No focal diverticulitis. Formed stool in the proximal colon. Vascular/Lymphatic: Abdominal aorta is normal in caliber. The portal vein is patent. Mesenteric vessels are patent. No abdominopelvic adenopathy. Reproductive: Status post hysterectomy. No adnexal masses. Other: Mild fat stranding and trace free fluid in the left pericolic gutter. No free air or focal abscess. No significant ascites. Musculoskeletal: Multilevel degenerative change in the spine with degenerative disc disease and facet hypertrophy. There are no acute or suspicious osseous abnormalities. IMPRESSION: 1. Colitis involving the proximal through distal descending colon, may be infectious or inflammatory. 2. Mild descending and sigmoid colonic diverticulosis without focal diverticulitis. 3. Mild hepatic steatosis. Scattered subcentimeter low-density lesions in the liver are too small to accurately characterize but likely cysts, hemangiomas or hamartomas. Electronically Signed   By: Keith Rake M.D.   On: 02/11/2020 15:21    Procedures Procedures (including critical care time)  Medications Ordered in ED Medications  sodium chloride (PF)  0.9 % injection (has no administration in time range)  iohexol (OMNIPAQUE) 300 MG/ML solution 100 mL (100 mLs Intravenous Contrast Given 02/11/20 1459)    ED Course  I have reviewed the triage vital signs and  the nursing notes.  Pertinent labs & imaging results that were available during my care of the patient were reviewed by me and considered in my medical decision making (see chart for details).    MDM Rules/Calculators/A&P                      MDM:  Pt has elevated wbc count.  Ct scan shows colitis,  No abscess, no diverticulitis.  Pt advised to follow up with Gi for recheck.  rx for cipro, flagy and zofran.   Pt has had c diff second to medications in the past.  Pt counseled on cdiff concerns with antibiotics  Final Clinical Impression(s) / ED Diagnoses Final diagnoses:  Colitis    Rx / DC Orders ED Discharge Orders         Ordered    ciprofloxacin (CIPRO) 500 MG tablet  2 times daily     02/11/20 1627    metroNIDAZOLE (FLAGYL) 500 MG tablet  2 times daily     02/11/20 1627    ondansetron (ZOFRAN) 4 MG tablet  Daily PRN     02/11/20 1627        An After Visit Summary was printed and given to the patient.    Fransico Meadow, PA-C 02/11/20 2044    Sidney Ace 02/11/20 2045    Truddie Hidden, MD 02/13/20 [phone]

## 2020-02-11 NOTE — Telephone Encounter (Signed)
Spoke with pt and she is aware of Dr. Perry's recommendation. 

## 2020-02-11 NOTE — Telephone Encounter (Signed)
Not sure what is going on.  New problem.  We have not seen her in this office in well over a year.  Given the degree of pain and bleeding she should go to the emergency room for a formal evaluation

## 2020-02-11 NOTE — ED Triage Notes (Signed)
Has history of constipation, yesterday blood in stoll , states she has had 3 bowel movements with approx 1/2 cup of blood x 2 and less the 3rd time, abd cramping. Nausea, not eating well last day.

## 2020-02-11 NOTE — Discharge Instructions (Addendum)
Follow up with your Gi doctor for recheck in 1 week.  Return if any problems.

## 2020-02-18 DIAGNOSIS — M545 Low back pain: Secondary | ICD-10-CM | POA: Diagnosis not present

## 2020-02-18 DIAGNOSIS — M542 Cervicalgia: Secondary | ICD-10-CM | POA: Diagnosis not present

## 2020-02-28 ENCOUNTER — Ambulatory Visit (INDEPENDENT_AMBULATORY_CARE_PROVIDER_SITE_OTHER): Payer: BC Managed Care – PPO | Admitting: Physician Assistant

## 2020-02-28 ENCOUNTER — Encounter: Payer: Self-pay | Admitting: Physician Assistant

## 2020-02-28 VITALS — BP 120/70 | HR 101 | Temp 98.4°F | Ht 66.0 in | Wt 174.0 lb

## 2020-02-28 DIAGNOSIS — K529 Noninfective gastroenteritis and colitis, unspecified: Secondary | ICD-10-CM | POA: Diagnosis not present

## 2020-02-28 NOTE — Progress Notes (Signed)
Assessment and plan reviewed 

## 2020-02-28 NOTE — Patient Instructions (Addendum)
If you are age 64 or older, your body mass index should be between 23-30. Your Body mass index is 28.08 kg/m. If this is out of the aforementioned range listed, please consider follow up with your Primary Care Provider.  If you are age 37 or younger, your body mass index should be between 19-25. Your Body mass index is 28.08 kg/m. If this is out of the aformentioned range listed, please consider follow up with your Primary Care Provider.   Continue Floratsor daily.   Follow up as needed.

## 2020-02-28 NOTE — Progress Notes (Signed)
Chief Complaint: Follow-up ED visit for colitis  HPI:    Erica Potts is a 64 year old female with a past medical history as listed below, known to Dr. Henrene Pastor, who presents to clinic today after being seen in the ER and diagnosed with colitis.    08/03/2018 colonoscopy with Dr. Henrene Pastor done for personal history of nonadvanced adenoma with a finding of diverticulosis in the sigmoid colon, ascending colon AVM and otherwise normal.  Repeat was recommended in 10 years.       02/11/2020 patient seen in the ED with abdominal pain and hematochezia.  Labs showed a potassium of 3.4 and otherwise normal CMP.  CBC with a white count of 15, urinalysis normal.  Lipase normal.  CT abdomen pelvis with contrast showed colitis involving the proximal through distal descending colon, mild descending and sigmoid colonic diverticulosis without focal diverticulitis, mild hepatic steatosis with scattered subcentimeter low-density lesions in the liver which were too small to accurately characterize but were likely cyst, hemangiomas or hamartomas.  Patient was given 10 days of ciprofloxacin 500 mg twice daily and Flagyl 500 mg twice daily.  Continued on her Florastor given history of C. Difficile.    Today, the patient tells me that she took her antibiotics for 10 days and has had no further problems.  Her abdominal pain completely resolved she has seen no further hematochezia and is currently having normal bowel movements.  Tells me that the days leading up to this incident she was constipated which she really tries to avoid given her chronic pain medicine use.  Typically she uses Colace on a daily basis to counteract, which works, but she had been constipated for 2 or 3 days and was trying to use increased laxatives in order to have a bowel movement.  Then when she did it was bloody with a lot of abdominal pain proceeding.    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    Denies fever, chills, continued  blood in her stool, weight loss or symptoms that awaken her from sleep.  Past Medical History:  Diagnosis Date  . Anxiety   . Clostridium difficile infection   . Colitis   . DDD (degenerative disc disease), lumbar   . Depression   . HLD (hyperlipidemia)   . HTN (hypertension)   . Insomnia   . Melanoma ([place]) 2000's  . Ovarian cyst, left   . Palpitations   . Spinal stenosis     Past Surgical History:  Procedure Laterality Date  . BREAST SURGERY    . [place]   x 2  . COLONOSCOPY    . DILATION AND CURETTAGE OF UTERUS  2012  . LAPAROSCOPIC TOTAL HYSTERECTOMY  2013  . MELANOMA EXCISION    . MENISCUS REPAIR Left 2011  . MOUTH SURGERY Right    maxilary  . nipple biopsy    . OVARIAN CYST REMOVAL  1990  . POLYPECTOMY    . TUBAL LIGATION    . UPPER GASTROINTESTINAL ENDOSCOPY      Current Outpatient Medications  Medication Sig Dispense Refill  . ALPRAZolam (XANAX) 0.5 MG tablet Take 0.5 mg by mouth at bedtime as needed for sleep. Take 1/2 -1 tab at bedtime as needed    . aspirin EC 81 MG tablet Take 81 mg by mouth daily.    . cetirizine (ZYRTEC) 10 MG tablet Take 10 mg by mouth daily as needed.     . Cholecalciferol (VITAMIN D)  1000 UNITS capsule Take 2,000 Units by mouth daily.     . cyclobenzaprine (FLEXERIL) 10 MG tablet Take 10 mg by mouth at bedtime.     . diphenoxylate-atropine (LOMOTIL) 2.5-0.025 MG tablet Take 1 tablet by mouth as needed for diarrhea. 30 tablet 1  . docusate sodium (COLACE) 100 MG capsule Take 100 mg by mouth 2 (two) times daily.    Marland Kitchen gabapentin (NEURONTIN) 300 MG capsule Take 600 mg by mouth at bedtime. Take two capsules by mouth at bedtime as needed     . HYDROcodone-acetaminophen (NORCO) 7.5-325 MG per tablet Take 2 tablets by mouth 4 (four) times daily as needed for pain.     . indomethacin (INDOCIN) 25 MG capsule Take 1 capsule by mouth as needed.  3  . lisinopril (PRINIVIL,ZESTRIL) 10 MG tablet Take 10 mg by mouth daily.    .  meloxicam (MOBIC) 15 MG tablet Take 15 mg by mouth daily.    . Multiple Vitamin (MULTIVITAMIN) tablet Take 1 tablet by mouth daily.    . Omega-3 Fatty Acids (FISH OIL) 1000 MG CAPS Take 2 capsules by mouth daily.    Marland Kitchen omeprazole (PRILOSEC) 40 MG capsule TAKE ONE CAPSULE BY MOUTH EVERY DAY 30 capsule 1  . ondansetron (ZOFRAN) 4 MG tablet Take 1 tablet (4 mg total) by mouth daily as needed for nausea or vomiting. 30 tablet 1  . saccharomyces boulardii (FLORASTOR) 250 MG capsule Take 500 mg by mouth daily.     Marland Kitchen zolpidem (AMBIEN) 10 MG tablet Take 5 mg by mouth at bedtime as needed for sleep.      No current facility-administered medications for this visit.    Allergies as of 02/28/2020  . (No Known Allergies)    Family History  Problem Relation Age of Onset  . Heart attack Mother   . Prostate cancer Father   . Colon polyps Father   . Dementia Father   . Colon cancer Neg Hx   . Esophageal cancer Neg Hx   . Rectal cancer Neg Hx   . Stomach cancer Neg Hx     Social History   Socioeconomic History  . Marital status: Divorced    Spouse name: Not on file  . Number of children: 2  . Years of education: Not on file  . Highest education level: Not on file  Occupational History  . Occupation: Therapist, sports    Comment: [employer].  Tobacco Use  . Smoking status: Former Smoker    Packs/day: 1.00    Types: Cigarettes    Quit date: 11/17/1991    Years since quitting: 28.3  . Smokeless tobacco: Never Used  Substance and Sexual Activity  . Alcohol use: Yes    Alcohol/week: 7.0 - 10.0 standard drinks    Types: 7 - 10 Cans of beer per week    Comment: occ.  . Drug use: No  . Sexual activity: Not on file  Other Topics Concern  . Not on file  Social History Narrative  . Not on file   Social Determinants of Health   Financial Resource Strain:   . Difficulty of Paying Living Expenses:   Food Insecurity:   . Worried About Charity fundraiser in the Last Year:   . Arboriculturist in  the Last Year:   Transportation Needs:   . Film/video editor (Medical):   Marland Kitchen Lack of Transportation (Non-Medical):   Physical Activity:   . Days of Exercise per Week:   .  Minutes of Exercise per Session:   Stress:   . Feeling of Stress :   Social Connections:   . Frequency of Communication with Friends and Family:   . Frequency of Social Gatherings with Friends and Family:   . Attends Religious Services:   . Active Member of Clubs or Organizations:   . Attends Archivist Meetings:   Marland Kitchen Marital Status:   Intimate Partner Violence:   . Fear of Current or Ex-Partner:   . Emotionally Abused:   Marland Kitchen Physically Abused:   . Sexually Abused:     Review of Systems:    Constitutional: No weight loss, fever or chills Cardiovascular: No chest pain  Respiratory: No SOB Gastrointestinal: See HPI and otherwise negative   Physical Exam:  Vital signs: BP 120/70   Pulse (!) 101   Temp 98.4 F (36.9 C)   Ht 5\' 6"  (1.676 m)   Wt 174 lb (78.9 kg)   BMI 28.08 kg/m   Constitutional:   Pleasant Caucasian female appears to be in NAD, Well developed, Well nourished, alert and cooperative Respiratory: Respirations even and unlabored. Lungs clear to auscultation bilaterally.   No wheezes, crackles, or rhonchi.  Cardiovascular: Normal S1, S2. No MRG. Regular rate and rhythm. No peripheral edema, cyanosis or pallor.  Gastrointestinal:  Soft, nondistended, nontender. No rebound or guarding. Normal bowel sounds. No appreciable masses or hepatomegaly. Rectal:  Not performed.  Psychiatric: Demonstrates good judgement and reason without abnormal affect or behaviors.  RELEVANT LABS AND IMAGING: CBC    Component Value Date/Time   WBC 15.0 (H) 02/11/2020 1220   RBC 4.20 02/11/2020 1220   HGB 12.5 02/11/2020 1220   HCT 38.9 02/11/2020 1220   PLT 451 (H) 02/11/2020 1220   MCV 92.6 02/11/2020 1220   MCH 29.8 02/11/2020 1220   MCHC 32.1 02/11/2020 1220   RDW 16.2 (H) 02/11/2020 1220     CMP     Component Value Date/Time   NA 136 02/11/2020 1220   K 3.4 (L) 02/11/2020 1220   CL 104 02/11/2020 1220   CO2 25 02/11/2020 1220   GLUCOSE 89 02/11/2020 1220   BUN 7 (L) 02/11/2020 1220   CREATININE 0.72 02/11/2020 1220   CALCIUM 8.7 (L) 02/11/2020 1220   PROT 6.9 02/11/2020 1220   ALBUMIN 4.3 02/11/2020 1220   AST 23 02/11/2020 1220   ALT 19 02/11/2020 1220   ALKPHOS 43 02/11/2020 1220   BILITOT 0.9 02/11/2020 1220   GFRNONAA >60 02/11/2020 1220   GFRAA >60 02/11/2020 1220    Assessment: 1.  Colitis: Sounds ischemic from the patient's history of increased laxative use constipation and then severe abdominal pain prior to hematochezia, regardless resolved after 10 days of antibiotics  Plan: 1.  Discussed ischemic colitis with the patient today.  This sounds most likely to revert represent her symptoms and history.  Would recommend that she continue her laxative use and try to avoid days of constipation.  Discussed using 3-4 doses of MiraLAX if she becomes constipated to avoid multiple days in a row of other laxative use. 2.  She can continue her Florastor 2 tabs a day.  She has been doing this for a long time ever since C. difficile infection. 3.  Last colonoscopy in 2019 was reviewed, repeat was recommended in 10 years. 4.  Patient follow in clinic with Dr. Henrene Pastor or myself as needed in the future.  Did discuss if she has another episode then we may need to do further  investigation.  She is not on any chronic anticholinergic medicines.  Erica Newer, PA-C [facility] 02/28/2020, 8:50 AM  Cc: Leanna Battles, MD

## 2020-03-03 DIAGNOSIS — M542 Cervicalgia: Secondary | ICD-10-CM | POA: Diagnosis not present

## 2020-03-03 DIAGNOSIS — M545 Low back pain: Secondary | ICD-10-CM | POA: Diagnosis not present

## 2020-03-24 DIAGNOSIS — M545 Low back pain: Secondary | ICD-10-CM | POA: Diagnosis not present

## 2020-03-24 DIAGNOSIS — M542 Cervicalgia: Secondary | ICD-10-CM | POA: Diagnosis not present

## 2020-04-20 DIAGNOSIS — M542 Cervicalgia: Secondary | ICD-10-CM | POA: Diagnosis not present

## 2020-04-20 DIAGNOSIS — M545 Low back pain: Secondary | ICD-10-CM | POA: Diagnosis not present

## 2020-05-11 DIAGNOSIS — M545 Low back pain: Secondary | ICD-10-CM | POA: Diagnosis not present

## 2020-05-11 DIAGNOSIS — M542 Cervicalgia: Secondary | ICD-10-CM | POA: Diagnosis not present

## 2020-06-03 DIAGNOSIS — M545 Low back pain: Secondary | ICD-10-CM | POA: Diagnosis not present

## 2020-06-03 DIAGNOSIS — M542 Cervicalgia: Secondary | ICD-10-CM | POA: Diagnosis not present

## 2020-06-14 DIAGNOSIS — Z20822 Contact with and (suspected) exposure to covid-19: Secondary | ICD-10-CM | POA: Diagnosis not present

## 2020-06-25 DIAGNOSIS — M545 Low back pain: Secondary | ICD-10-CM | POA: Diagnosis not present

## 2020-06-25 DIAGNOSIS — M542 Cervicalgia: Secondary | ICD-10-CM | POA: Diagnosis not present

## 2020-06-27 DIAGNOSIS — Z20822 Contact with and (suspected) exposure to covid-19: Secondary | ICD-10-CM | POA: Diagnosis not present

## 2020-07-16 DIAGNOSIS — M545 Low back pain: Secondary | ICD-10-CM | POA: Diagnosis not present

## 2020-07-16 DIAGNOSIS — M542 Cervicalgia: Secondary | ICD-10-CM | POA: Diagnosis not present

## 2020-07-31 DIAGNOSIS — E875 Hyperkalemia: Secondary | ICD-10-CM | POA: Diagnosis not present

## 2020-08-04 DIAGNOSIS — Z23 Encounter for immunization: Secondary | ICD-10-CM | POA: Diagnosis not present

## 2020-08-04 DIAGNOSIS — I10 Essential (primary) hypertension: Secondary | ICD-10-CM | POA: Diagnosis not present

## 2020-08-11 DIAGNOSIS — Z01419 Encounter for gynecological examination (general) (routine) without abnormal findings: Secondary | ICD-10-CM | POA: Diagnosis not present

## 2020-08-11 DIAGNOSIS — Z6828 Body mass index (BMI) 28.0-28.9, adult: Secondary | ICD-10-CM | POA: Diagnosis not present

## 2020-08-13 DIAGNOSIS — M545 Low back pain, unspecified: Secondary | ICD-10-CM | POA: Diagnosis not present

## 2020-08-13 DIAGNOSIS — M542 Cervicalgia: Secondary | ICD-10-CM | POA: Diagnosis not present

## 2020-08-19 MED ORDER — PANTOPRAZOLE SODIUM 40 MG PO TBEC: 40.0000 mg | DELAYED_RELEASE_TABLET | Freq: Every day | ORAL | 6 refills | Status: DC

## 2020-08-24 ENCOUNTER — Other Ambulatory Visit: Payer: Self-pay

## 2020-08-24 MED ORDER — PANTOPRAZOLE SODIUM 40 MG PO TBEC: 40.0000 mg | DELAYED_RELEASE_TABLET | Freq: Every day | ORAL | 6 refills | Status: DC

## 2020-09-22 DIAGNOSIS — M545 Low back pain, unspecified: Secondary | ICD-10-CM | POA: Diagnosis not present

## 2020-09-22 DIAGNOSIS — M542 Cervicalgia: Secondary | ICD-10-CM | POA: Diagnosis not present

## 2020-10-26 DIAGNOSIS — H5203 Hypermetropia, bilateral: Secondary | ICD-10-CM | POA: Diagnosis not present

## 2020-10-26 DIAGNOSIS — H10413 Chronic giant papillary conjunctivitis, bilateral: Secondary | ICD-10-CM | POA: Diagnosis not present

## 2020-10-27 DIAGNOSIS — M542 Cervicalgia: Secondary | ICD-10-CM | POA: Diagnosis not present

## 2020-10-27 DIAGNOSIS — M545 Low back pain, unspecified: Secondary | ICD-10-CM | POA: Diagnosis not present

## 2020-12-04 DIAGNOSIS — M542 Cervicalgia: Secondary | ICD-10-CM | POA: Diagnosis not present

## 2020-12-04 DIAGNOSIS — M545 Low back pain, unspecified: Secondary | ICD-10-CM | POA: Diagnosis not present

## 2020-12-10 ENCOUNTER — Telehealth: Payer: Self-pay

## 2020-12-10 DIAGNOSIS — Z85820 Personal history of malignant melanoma of skin: Secondary | ICD-10-CM | POA: Diagnosis not present

## 2020-12-10 DIAGNOSIS — D2271 Melanocytic nevi of right lower limb, including hip: Secondary | ICD-10-CM | POA: Diagnosis not present

## 2020-12-10 DIAGNOSIS — L570 Actinic keratosis: Secondary | ICD-10-CM | POA: Diagnosis not present

## 2020-12-10 DIAGNOSIS — D225 Melanocytic nevi of trunk: Secondary | ICD-10-CM | POA: Diagnosis not present

## 2020-12-10 DIAGNOSIS — D2272 Melanocytic nevi of left lower limb, including hip: Secondary | ICD-10-CM | POA: Diagnosis not present

## 2020-12-10 NOTE — Telephone Encounter (Signed)
Lm on vm for patient to return call 

## 2020-12-10 NOTE — Telephone Encounter (Signed)
Patient called back and scheduled her with Anderson Malta on 12/24/2020.

## 2020-12-10 NOTE — Telephone Encounter (Signed)
-----   Message from Levin Erp, [place] sent at 12/10/2020  9:02 AM EST ----- Regarding: OV Please call patient and schedule her for an appointment with me.  Thanks-JLL

## 2020-12-11 DIAGNOSIS — M542 Cervicalgia: Secondary | ICD-10-CM | POA: Diagnosis not present

## 2020-12-11 DIAGNOSIS — M545 Low back pain, unspecified: Secondary | ICD-10-CM | POA: Diagnosis not present

## 2020-12-22 DIAGNOSIS — M545 Low back pain, unspecified: Secondary | ICD-10-CM | POA: Diagnosis not present

## 2020-12-22 DIAGNOSIS — M542 Cervicalgia: Secondary | ICD-10-CM | POA: Diagnosis not present

## 2020-12-24 ENCOUNTER — Other Ambulatory Visit: Payer: Self-pay

## 2020-12-24 ENCOUNTER — Encounter: Payer: Self-pay | Admitting: Physician Assistant

## 2020-12-24 ENCOUNTER — Ambulatory Visit (INDEPENDENT_AMBULATORY_CARE_PROVIDER_SITE_OTHER): Payer: BC Managed Care – PPO | Admitting: Physician Assistant

## 2020-12-24 VITALS — BP 106/60 | HR 84 | Ht 66.0 in | Wt 171.0 lb

## 2020-12-24 DIAGNOSIS — R1084 Generalized abdominal pain: Secondary | ICD-10-CM

## 2020-12-24 DIAGNOSIS — R197 Diarrhea, unspecified: Secondary | ICD-10-CM

## 2020-12-24 NOTE — Progress Notes (Signed)
Assessment and plan reviewed 

## 2020-12-24 NOTE — Progress Notes (Signed)
Chief Complaint: Abdominal pain  HPI:    Erica Potts is a 65 year old Caucasian female with a past medical history as listed below, known to Dr. Henrene Pastor, who presents to clinic today after an episode of abdominal pain.     08/03/2018 colonoscopy with Dr. Henrene Pastor done for personal history of nonadvanced adenoma with a finding of diverticulosis in the sigmoid colon, ascending colon AVM and otherwise normal.  Repeat was recommended in 10 years.       02/11/2020 patient seen in the ED with abdominal pain and hematochezia.  Labs showed a potassium of 3.4 and otherwise normal CMP.  CBC with a white count of 15, urinalysis normal.  Lipase normal.  CT abdomen pelvis with contrast showed colitis involving the proximal through distal descending colon, mild descending and sigmoid colonic diverticulosis without focal diverticulitis, mild hepatic steatosis with scattered subcentimeter low-density lesions in the liver which were too small to accurately characterize but were likely cyst, hemangiomas or hamartomas.  Patient was given 10 days of ciprofloxacin 500 mg twice daily and Flagyl 500 mg twice daily.  Continued on her Florastor given history of C. Difficile.    02/28/2020 patient followed up in office after episode above and had no further problems after 10 days of antibiotics.  At that time the episode sounded ischemic.  She was encouraged to avoid constipation with MiraLAX/Colace.  Her next colonoscopy was noted to be due in 2029.    Today, the patient presents to clinic and tells me that about 10 days ago she woke up in the middle the night after eating seafood the night before for dinner and had intense abdominal spasms and cold sweats and multiple episodes of diarrhea, after this she went back to bed and remained on clear liquids the next day and then soft the next and gradually increase her diet back to normal.  She has not had any further episodes since then and the pain is gone.  Tells me that she continues on a  probiotic and yogurt daily and has been doing well.  Does ask about knots in the future.    Denies fever, chills, blood in her stool or weight loss.  Past Medical History:  Diagnosis Date  . Anxiety   . Clostridium difficile infection   . Colitis   . DDD (degenerative disc disease), lumbar   . Depression   . HLD (hyperlipidemia)   . HTN (hypertension)   . Insomnia   . Melanoma ([place]) 2000's  . Ovarian cyst, left   . Palpitations   . Spinal stenosis     Past Surgical History:  Procedure Laterality Date  . BREAST SURGERY    . [place]   x 2  . COLONOSCOPY    . DILATION AND CURETTAGE OF UTERUS  2012  . LAPAROSCOPIC TOTAL HYSTERECTOMY  2013  . MELANOMA EXCISION    . MENISCUS REPAIR Left 2011  . MOUTH SURGERY Right    maxilary  . nipple biopsy    . OVARIAN CYST REMOVAL  1990  . POLYPECTOMY    . TUBAL LIGATION    . UPPER GASTROINTESTINAL ENDOSCOPY      Current Outpatient Medications  Medication Sig Dispense Refill  . ALPRAZolam (XANAX) 0.5 MG tablet Take 0.5 mg by mouth at bedtime as needed for sleep. Take 1/2 -1 tab at bedtime as needed    . aspirin EC 81 MG tablet Take 81 mg by mouth daily.    . Cholecalciferol (VITAMIN D)  1000 UNITS capsule Take 2,000 Units by mouth daily.     . cyclobenzaprine (FLEXERIL) 10 MG tablet Take 10 mg by mouth at bedtime.    . diphenoxylate-atropine (LOMOTIL) 2.5-0.025 MG tablet Take 1 tablet by mouth as needed for diarrhea. 30 tablet 1  . docusate sodium (COLACE) 100 MG capsule Take 100 mg by mouth at bedtime.    . gabapentin (NEURONTIN) 300 MG capsule Take 600 mg by mouth at bedtime. Take two capsules by mouth at bedtime as needed    . HYDROcodone-acetaminophen (NORCO) 7.5-325 MG per tablet Take 2 tablets by mouth 4 (four) times daily as needed for pain. Patient only taking 2 tablets three times daily    . indomethacin (INDOCIN) 25 MG capsule Take 1 capsule by mouth as needed.  3  . lisinopril (PRINIVIL,ZESTRIL) 10 MG  tablet Take 10 mg by mouth daily.    . meloxicam (MOBIC) 15 MG tablet Take 15 mg by mouth daily.    . Multiple Vitamin (MULTIVITAMIN) tablet Take 1 tablet by mouth daily.    . Omega-3 Fatty Acids (FISH OIL) 1000 MG CAPS Take 2 capsules by mouth daily.    . ondansetron (ZOFRAN) 4 MG tablet Take 1 tablet (4 mg total) by mouth daily as needed for nausea or vomiting. 30 tablet 1  . saccharomyces boulardii (FLORASTOR) 250 MG capsule Take 500 mg by mouth daily.     Marland Kitchen zolpidem (AMBIEN) 10 MG tablet Take 5 mg by mouth at bedtime as needed for sleep.     . pantoprazole (PROTONIX) 40 MG tablet Take 1 tablet (40 mg total) by mouth daily. 30 tablet 6   No current facility-administered medications for this visit.    Allergies as of 12/24/2020  . (No Known Allergies)    Family History  Problem Relation Age of Onset  . Heart attack Mother   . Prostate cancer Father   . Colon polyps Father   . Dementia Father   . Colon cancer Neg Hx   . Esophageal cancer Neg Hx   . Rectal cancer Neg Hx   . Stomach cancer Neg Hx     Social History   Socioeconomic History  . Marital status: Divorced    Spouse name: Not on file  . Number of children: 2  . Years of education: Not on file  . Highest education level: Not on file  Occupational History  . Occupation: Therapist, sports    Comment: [employer].  Tobacco Use  . Smoking status: Former Smoker    Packs/day: 1.00    Types: Cigarettes    Quit date: 11/17/1991    Years since quitting: 29.1  . Smokeless tobacco: Never Used  Vaping Use  . Vaping Use: Never used  Substance and Sexual Activity  . Alcohol use: Yes    Comment: 9-67 alcoholic beverages weekly  . Drug use: No  . Sexual activity: Not on file  Other Topics Concern  . Not on file  Social History Narrative  . Not on file   Social Determinants of Health   Financial Resource Strain: Not on file  Food Insecurity: Not on file  Transportation Needs: Not on file  Physical Activity: Not on file   Stress: Not on file  Social Connections: Not on file  Intimate Partner Violence: Not on file    Review of Systems:    Constitutional: No weight loss, fever or chills Cardiovascular: No chest pain Respiratory: No SOB Gastrointestinal: See HPI and otherwise negative   Physical  Exam:  Vital signs: BP 106/60   Pulse 84   Ht 5\' 6"  (1.676 m)   Wt 171 lb (77.6 kg)   BMI 27.60 kg/m   Constitutional:   Pleasant Caucasian female appears to be in NAD, Well developed, Well nourished, alert and cooperative Respiratory: Respirations even and unlabored. Lungs clear to auscultation bilaterally.   No wheezes, crackles, or rhonchi.  Cardiovascular: Normal S1, S2. No MRG. Regular rate and rhythm. No peripheral edema, cyanosis or pallor.  Gastrointestinal:  Soft, nondistended, nontender. No rebound or guarding. Normal bowel sounds. No appreciable masses or hepatomegaly. Rectal:  Not performed.  Psychiatric: Oriented to person, place and time. Demonstrates good judgement and reason without abnormal affect or behaviors.  No recent labs.  Assessment: 1.  Generalized abdominal pain and diarrhea: 1 episode of intense abdominal spasms which woke her from her sleep about 10 days ago, she had multiple episodes of diarrhea and then was fine afterwards; most likely food poisoning versus viral  Plan: 1.  Encouraged the patient that she is okay, she does not need any further evaluation at this time.  Continue probiotics and avoiding constipation in the future. 2.  Reviewed that her next colonoscopy is not due until 2029. 3.  Patient follow clinic with me or Dr. Henrene Pastor as needed.  Ellouise Newer, PA-C [facility] 12/24/2020, 8:52 AM  Cc: Leanna Battles, MD

## 2021-01-01 DIAGNOSIS — M542 Cervicalgia: Secondary | ICD-10-CM | POA: Diagnosis not present

## 2021-01-01 DIAGNOSIS — M545 Low back pain, unspecified: Secondary | ICD-10-CM | POA: Diagnosis not present

## 2021-01-08 DIAGNOSIS — M542 Cervicalgia: Secondary | ICD-10-CM | POA: Diagnosis not present

## 2021-01-08 DIAGNOSIS — M545 Low back pain, unspecified: Secondary | ICD-10-CM | POA: Diagnosis not present

## 2021-01-22 DIAGNOSIS — M542 Cervicalgia: Secondary | ICD-10-CM | POA: Diagnosis not present

## 2021-01-22 DIAGNOSIS — M545 Low back pain, unspecified: Secondary | ICD-10-CM | POA: Diagnosis not present

## 2021-02-05 DIAGNOSIS — M542 Cervicalgia: Secondary | ICD-10-CM | POA: Diagnosis not present

## 2021-02-05 DIAGNOSIS — M545 Low back pain, unspecified: Secondary | ICD-10-CM | POA: Diagnosis not present

## 2021-02-26 DIAGNOSIS — M545 Low back pain, unspecified: Secondary | ICD-10-CM | POA: Diagnosis not present

## 2021-02-26 DIAGNOSIS — M542 Cervicalgia: Secondary | ICD-10-CM | POA: Diagnosis not present

## 2021-03-04 DIAGNOSIS — E785 Hyperlipidemia, unspecified: Secondary | ICD-10-CM | POA: Diagnosis not present

## 2021-03-12 DIAGNOSIS — R82998 Other abnormal findings in urine: Secondary | ICD-10-CM | POA: Diagnosis not present

## 2021-04-16 DIAGNOSIS — M542 Cervicalgia: Secondary | ICD-10-CM | POA: Diagnosis not present

## 2021-04-16 DIAGNOSIS — M545 Low back pain, unspecified: Secondary | ICD-10-CM | POA: Diagnosis not present

## 2021-04-28 ENCOUNTER — Other Ambulatory Visit: Payer: Self-pay | Admitting: Internal Medicine

## 2021-05-26 DIAGNOSIS — I10 Essential (primary) hypertension: Secondary | ICD-10-CM | POA: Insufficient documentation

## 2021-05-26 DIAGNOSIS — A6000 Herpesviral infection of urogenital system, unspecified: Secondary | ICD-10-CM | POA: Insufficient documentation

## 2021-06-15 ENCOUNTER — Ambulatory Visit: Payer: BC Managed Care – PPO | Admitting: Internal Medicine

## 2021-06-18 ENCOUNTER — Ambulatory Visit (INDEPENDENT_AMBULATORY_CARE_PROVIDER_SITE_OTHER): Payer: Medicare Other | Admitting: Internal Medicine

## 2021-06-18 ENCOUNTER — Encounter: Payer: Self-pay | Admitting: Internal Medicine

## 2021-06-18 VITALS — BP 112/62 | HR 80 | Ht 66.0 in | Wt 161.0 lb

## 2021-06-18 DIAGNOSIS — K5520 Angiodysplasia of colon without hemorrhage: Secondary | ICD-10-CM

## 2021-06-18 DIAGNOSIS — D509 Iron deficiency anemia, unspecified: Secondary | ICD-10-CM | POA: Diagnosis not present

## 2021-06-18 DIAGNOSIS — Z87898 Personal history of other specified conditions: Secondary | ICD-10-CM

## 2021-06-18 DIAGNOSIS — Z86010 Personal history of colonic polyps: Secondary | ICD-10-CM

## 2021-06-18 MED ORDER — SUTAB 1479-225-188 MG PO TABS: 1.0000 | ORAL_TABLET | Freq: Once | ORAL | 0 refills | Status: AC

## 2021-06-18 NOTE — Patient Instructions (Signed)
If you are age 65 or older, your body mass index should be between 23-30. Your Body mass index is 25.99 kg/m. If this is out of the aforementioned range listed, please consider follow up with your Primary Care Provider.  If you are age 58 or younger, your body mass index should be between 19-25. Your Body mass index is 25.99 kg/m. If this is out of the aformentioned range listed, please consider follow up with your Primary Care Provider.   __________________________________________________________  The [facility] providers would like to encourage you to use [facility] to communicate with providers for non-urgent requests or questions.  Due to long hold times on the telephone, sending your provider a message by [facility] may be a faster and more efficient way to get a response.  Please allow 48 business hours for a response.  Please remember that this is for non-urgent requests.   You have been scheduled for an endoscopy and colonoscopy. Please follow the written instructions given to you at your visit today. Please pick up your prep supplies at the pharmacy within the next 1-3 days. If you use inhalers (even only as needed), please bring them with you on the day of your procedure.

## 2021-06-18 NOTE — Progress Notes (Signed)
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  The patient has a history of NSAID induced ulcer disease, Candida esophagitis, C. difficile related diarrhea, and adenomatous colon polyps.  She was evaluated earlier this year for abdominal pain and diarrhea.  She was felt to have possible food poisoning or viral gastroenteritis.  Previous CT April 2021 revealed left-sided colitis and diverticulosis.  Problems with diarrhea have resolved without recurrence.  She does take meloxicam daily and indomethacin as needed.  Because of this, and daily aspirin, she is on prophylactic pantoprazole 40 mg daily.  She denies abdominal pain, melena, or hematochezia.  Hemoglobin from April 2021 was normal at 12.5 with MCV 92.6.  Hemoglobin from March 04, 2021 was 10.2.  MCV 80.3.  She was diagnosed with iron deficiency.  She was not placed on iron replacement therapy.  She does complain of fatigue.  She last underwent complete colonoscopy and upper endoscopy August 03, 2018.  Colonoscopy revealed sigmoid diverticulosis and incidental ascending colon AVM.  Follow-up in 10 years recommended.  Upper endoscopy revealed Candida esophagitis.  Small hyperplastic gastric polyps.  No active ulcer disease.  She is not a blood donor  REVIEW OF SYSTEMS:  All non-GI ROS negative entirely  Past Medical History:  Diagnosis Date   Anxiety    Clostridium difficile infection    Colitis    DDD (degenerative disc disease), lumbar    Depression    HLD (hyperlipidemia)    HTN (hypertension)    Insomnia    Melanoma ([place]) 2000's   Ovarian cyst, left    Palpitations    Spinal stenosis     Past Surgical History:  Procedure Laterality Date   BREAST SURGERY     CESAREAN SECTION  1983, 1986   x 2   COLONOSCOPY     DILATION AND CURETTAGE OF UTERUS  2012   LAPAROSCOPIC TOTAL HYSTERECTOMY  2013    MELANOMA EXCISION     MENISCUS REPAIR Left 2011   MOUTH SURGERY Right    maxilary   nipple biopsy     OVARIAN CYST REMOVAL  1990   POLYPECTOMY     TUBAL LIGATION     UPPER GASTROINTESTINAL ENDOSCOPY      Social History Erica Potts  reports that she quit smoking about 29 years ago. Her smoking use included cigarettes. She smoked an average of 1 pack per day. She has never used smokeless tobacco. She reports current alcohol use. She reports that she does not use drugs.  family history includes Colon polyps in her father; Dementia in her father; Esophageal cancer in her paternal grandfather; Heart attack in her mother; Lung cancer in her paternal grandfather; Prostate cancer in her father.  No Known Allergies     PHYSICAL EXAMINATION: Vital signs: BP 112/62   Pulse 80   Ht '5\' 6"'$  (1.676 m)   Wt 161 lb (73 kg)   BMI 25.99 kg/m   Constitutional: generally well-appearing, no acute distress Psychiatric: alert and oriented x3, cooperative Eyes: extraocular movements intact, anicteric, conjunctiva pink Mouth: oral pharynx moist, no lesions Neck: supple no lymphadenopathy Cardiovascular: heart regular rate and rhythm, no murmur Lungs: clear to auscultation bilaterally Abdomen: soft, nontender, nondistended, no obvious ascites, no peritoneal signs, normal bowel sounds, no organomegaly Rectal: Deferred until colonoscopy Extremities: no clubbing, cyanosis, or lower extremity edema bilaterally Skin: no lesions on visible extremities Neuro:  No focal deficits.  Cranial nerves intact  ASSESSMENT:  1.  New onset iron deficiency anemia.  Rule out GI mucosal disease. 2.  History of nonadvanced adenoma 3.  Colonic AVM on colonoscopy September 2019.  No neoplasia at that time 4.  History of ulcer disease.  Last upper endoscopy 2019 without active ulcer disease.  Has been maintained on PPI.  Continues to take NSAIDs 5.  History of Candida esophagitis  PLAN:  1.  Recommend daily  iron supplement 2.  Continue pantoprazole 3.  Schedule colonoscopy to evaluate iron deficiency anemia.  Possible causes include irritable neoplasia, chronic blood loss from AVMs.The nature of the procedure, as well as the risks, benefits, and alternatives were carefully and thoroughly reviewed with the patient. Ample time for discussion and questions allowed. The patient understood, was satisfied, and agreed to proceed.  4.  Schedule upper endoscopy to evaluate iron deficiency anemia.  Also duodenal biopsies.  Possible causes include recurrent ulcer disease, AVMs, sprue.The nature of the procedure, as well as the risks, benefits, and alternatives were carefully and thoroughly reviewed with the patient. Ample time for discussion and questions allowed. The patient understood, was satisfied, and agreed to proceed.  5.  Further recommendations after the above A total time of 40 minutes was spent preparing to see the patient, reviewing tests, obtaining comprehensive history, performing medically appropriate physical examination, counseling patient regarding her above listed issues, ordering medication, ordering advanced endoscopic procedures, and documenting clinical information in the health record

## 2021-07-16 ENCOUNTER — Encounter: Payer: Self-pay | Admitting: Internal Medicine

## 2021-07-16 ENCOUNTER — Ambulatory Visit (AMBULATORY_SURGERY_CENTER): Payer: Medicare Other | Admitting: Internal Medicine

## 2021-07-16 VITALS — BP 113/62 | HR 65 | Temp 98.0°F | Resp 11 | Ht 66.0 in | Wt 161.0 lb

## 2021-07-16 DIAGNOSIS — D509 Iron deficiency anemia, unspecified: Secondary | ICD-10-CM | POA: Diagnosis not present

## 2021-07-16 DIAGNOSIS — K31811 Angiodysplasia of stomach and duodenum with bleeding: Secondary | ICD-10-CM | POA: Diagnosis not present

## 2021-07-16 DIAGNOSIS — K5520 Angiodysplasia of colon without hemorrhage: Secondary | ICD-10-CM

## 2021-07-16 DIAGNOSIS — D125 Benign neoplasm of sigmoid colon: Secondary | ICD-10-CM | POA: Diagnosis present

## 2021-07-16 DIAGNOSIS — K5730 Diverticulosis of large intestine without perforation or abscess without bleeding: Secondary | ICD-10-CM | POA: Diagnosis not present

## 2021-07-16 DIAGNOSIS — Z86010 Personal history of colonic polyps: Secondary | ICD-10-CM

## 2021-07-16 NOTE — Op Note (Signed)
[facility] Patient Name: Erica Potts Procedure Date: 07/16/2021 1:14 PM MRN: [id number] Endoscopist: Docia Chuck. Henrene Pastor , MD Age: 65 Referring MD:  Date of Birth: [birth year] Gender: Female Account #: [id number] Procedure:                Upper GI endoscopy with control of bleeding Indications:              Iron deficiency anemia Medicines:                Monitored Anesthesia Care Procedure:                Pre-Anesthesia Assessment:                           - Prior to the procedure, a History and Physical                            was performed, and patient medications and                            allergies were reviewed. The patient's tolerance of                            previous anesthesia was also reviewed. The risks                            and benefits of the procedure and the sedation                            options and risks were discussed with the patient.                            All questions were answered, and informed consent                            was obtained. Prior Anticoagulants: The patient has                            taken no previous anticoagulant or antiplatelet                            agents. ASA Grade Assessment: II - A patient with                            mild systemic disease. After reviewing the risks                            and benefits, the patient was deemed in                            satisfactory condition to undergo the procedure.                           After obtaining informed consent, the endoscope was  passed under direct vision. Throughout the                            procedure, the patient's blood pressure, pulse, and                            oxygen saturations were monitored continuously. The                            Endoscope was introduced through the mouth, and                            advanced to the second part of duodenum. The upper                            GI  endoscopy was accomplished without difficulty.                            The patient tolerated the procedure well. Scope In: Scope Out: Findings:                 The esophagus was normal.                           The stomach revealed pooled hematin in the fundus.                            Evaluation of this area revealed a friable                            superficial vascular lesion with oozing. Likely                            AVM. This was coagulated with soft tip cautery.                            Oozing ceased.                           The examined duodenum was normal.                           The cardia and gastric fundus were normal on                            retroflexion. Complications:            No immediate complications. Estimated Blood Loss:     Estimated blood loss: none. Impression:               1. Small bleeding (oozing) vascular lesion in the                            gastric fundus, likely AVM. Status post coagulation  therapy                           2. Otherwise unremarkable EGD. Recommendation:           1. Continue pantoprazole 40 mg daily                           2. Avoid NSAIDs (meloxicam, indomethacin) if                            possible                           3. Repeat EGD to evaluate this area in 4 to 6                            weeks. Would perform at the hospital in case APC                            coagulation required. My nurse will contact you to                            set up this follow-up procedure.                           4. Continue iron supplement. If not taking, pick up                            over-the-counter iron supplement and take 1 daily. Docia Chuck. Henrene Pastor, MD 07/16/2021 2:18:37 PM This report has been signed electronically.

## 2021-07-16 NOTE — Patient Instructions (Signed)
Thank you for allowing [place] to care for you today! Await results, approximately 2 weeks by mail. Recommend next surveillance colonoscopy in 7 years. Repeat upper endoscopy in 4-6 weeks to evaluate area of previous bleeding.  Dr Blanch Media nurse will be calling you to set this up. Continue Pantoprazole 40 mg daily. Avoid all NSAIDS (Meloxicam, Ibuprofen, Aspirin, Indomethacin, Naproxen) if possible. Continue to take an iron supplement,  If not taking, an over the counter is fine, 1 daily.  YOU HAD AN ENDOSCOPIC PROCEDURE TODAY AT [facility]:   Refer to the procedure report that was given to you for any specific questions about what was found during the examination.  If the procedure report does not answer your questions, please call your gastroenterologist to clarify.  If you requested that your care partner not be given the details of your procedure findings, then the procedure report has been included in a sealed envelope for you to review at your convenience later.  YOU SHOULD EXPECT: Some feelings of bloating in the abdomen. Passage of more gas than usual.  Walking can help get rid of the air that was put into your GI tract during the procedure and reduce the bloating. If you had a lower endoscopy (such as a colonoscopy or flexible sigmoidoscopy) you may notice spotting of blood in your stool or on the toilet paper. If you underwent a bowel prep for your procedure, you may not have a normal bowel movement for a few days.  Please Note:  You might notice some irritation and congestion in your nose or some drainage.  This is from the oxygen used during your procedure.  There is no need for concern and it should clear up in a day or so.  SYMPTOMS TO REPORT IMMEDIATELY:  Following lower endoscopy (colonoscopy or flexible sigmoidoscopy):  Excessive amounts of blood in the stool  Significant tenderness or worsening of abdominal pains  Swelling of the abdomen that is new, acute  Fever of  100F or higher  Following upper endoscopy (EGD)  Vomiting of blood or coffee ground material  New chest pain or pain under the shoulder blades  Painful or persistently difficult swallowing  New shortness of breath  Fever of 100F or higher  Black, tarry-looking stools  For urgent or emergent issues, a gastroenterologist can be reached at any hour by calling [phone]. Do not use MyChart messaging for urgent concerns.    DIET:  We do recommend a small meal at first, but then you may proceed to your regular diet.  Drink plenty of fluids but you should avoid alcoholic beverages for 24 hours.  ACTIVITY:  You should plan to take it easy for the rest of today and you should NOT DRIVE or use heavy machinery until tomorrow (because of the sedation medicines used during the test).    FOLLOW UP: Our staff will call the number listed on your records 48-72 hours following your procedure to check on you and address any questions or concerns that you may have regarding the information given to you following your procedure. If we do not reach you, we will leave a message.  We will attempt to reach you two times.  During this call, we will ask if you have developed any symptoms of COVID 19. If you develop any symptoms (ie: fever, flu-like symptoms, shortness of breath, cough etc.) before then, please call [phone].  If you test positive for Covid 19 in the 2 weeks post procedure, please call  and report this information to [place].    If any biopsies were taken you will be contacted by phone or by letter within the next 1-3 weeks.  Please call us at [phone] if you have not heard about the biopsies in 3 weeks.    SIGNATURES/CONFIDENTIALITY: You and/or your care partner have signed paperwork which will be entered into your electronic medical record.  These signatures attest to the fact that that the information above on your After Visit Summary has been reviewed and is understood.  Full  responsibility of the confidentiality of this discharge information lies with you and/or your care-partner.

## 2021-07-16 NOTE — Progress Notes (Signed)
Pt in recovery with monitors in place, VSS. Report given to receiving RN. Bite guard was placed with pt awake to ensure comfort. No dental or soft tissue damage noted. RN will remove the guard when the pt is awake.  

## 2021-07-16 NOTE — Op Note (Signed)
[facility] Patient Name: Erica Potts Procedure Date: 07/16/2021 1:14 PM MRN: [id number] Endoscopist: Docia Chuck. Henrene Pastor , MD Age: 65 Referring MD:  Date of Birth: 06-28-1956 Gender: Female Account #: [id number] Procedure:                Colonoscopy with cold snare polypectomy x 1 Indications:              Iron deficiency anemia. Also history of nonadvanced                            adenoma. Previous colonoscopic examinations 2014,                            2019 Medicines:                Monitored Anesthesia Care Procedure:                Pre-Anesthesia Assessment:                           - Prior to the procedure, a History and Physical                            was performed, and patient medications and                            allergies were reviewed. The patient's tolerance of                            previous anesthesia was also reviewed. The risks                            and benefits of the procedure and the sedation                            options and risks were discussed with the patient.                            All questions were answered, and informed consent                            was obtained. Prior Anticoagulants: The patient has                            taken no previous anticoagulant or antiplatelet                            agents. ASA Grade Assessment: II - A patient with                            mild systemic disease. After reviewing the risks                            and benefits, the patient was deemed in  satisfactory condition to undergo the procedure.                           After obtaining informed consent, the colonoscope                            was passed under direct vision. Throughout the                            procedure, the patient's blood pressure, pulse, and                            oxygen saturations were monitored continuously. The                            Olympus CF-HQ190L  [id number]) Colonoscope was                            introduced through the anus and advanced to the the                            cecum, identified by appendiceal orifice and                            ileocecal valve. The ileocecal valve, appendiceal                            orifice, and rectum were photographed. The quality                            of the bowel preparation was excellent. The                            colonoscopy was performed without difficulty. The                            patient tolerated the procedure well. The bowel                            preparation used was SUPREP via split dose                            instruction. Scope In: 1:30:37 PM Scope Out: 1:49:52 PM Scope Withdrawal Time: 0 hours 16 minutes 10 seconds  Total Procedure Duration: 0 hours 19 minutes 15 seconds  Findings:                 A 3 mm polyp was found in the sigmoid colon. The                            polyp was removed with a cold snare. Resection and                            retrieval were complete.  A single medium-sized angiodysplastic lesion                            without bleeding was found in the proximal                            ascending colon.                           Multiple diverticula were found in the left colon.                           The exam was otherwise without abnormality on                            direct and retroflexion views. Complications:            No immediate complications. Estimated blood loss:                            None. Estimated Blood Loss:     Estimated blood loss: none. Impression:               - One 3 mm polyp in the sigmoid colon, removed with                            a cold snare. Resected and retrieved.                           - A single non-bleeding colonic angiodysplastic                            lesion. This can cause Hemoccult positive stool.                           - Diverticulosis in  the left colon.                           - The examination was otherwise normal on direct                            and retroflexion views. Recommendation:           - Repeat colonoscopy in 7 years for surveillance.                           - Patient has a contact number available for                            emergencies. The signs and symptoms of potential                            delayed complications were discussed with the                            patient. Return to normal activities tomorrow.  Written discharge instructions were provided to the                            patient.                           - Resume previous diet.                           - Continue present medications.                           - Await pathology results.                           - EGD today. Please see report Docia Chuck. Henrene Pastor, MD 07/16/2021 1:56:29 PM This report has been signed electronically.

## 2021-07-16 NOTE — Progress Notes (Signed)
GI PREPROCEDURE H&P  Patient presents today for colonoscopy and upper endoscopy to evaluate iron deficiency anemia.  She was fully evaluated by myself in the office June 18, 2021.  Please see that full H&P.  There have been no interval changes in the clinical history or her physical exam.

## 2021-07-16 NOTE — Progress Notes (Signed)
Pt's states no medical or surgical changes since previsit or office visit. 

## 2021-07-16 NOTE — Progress Notes (Signed)
Called to room to assist during endoscopic procedure.  Patient ID and intended procedure confirmed with present staff. Received instructions for my participation in the procedure from the performing physician.  

## 2021-07-20 ENCOUNTER — Telehealth: Payer: Self-pay

## 2021-07-20 ENCOUNTER — Telehealth: Payer: Self-pay | Admitting: *Deleted

## 2021-07-20 NOTE — Telephone Encounter (Deleted)
  Follow up Call-  Call back number 07/16/2021  Post procedure Call Back phone  # [phone]  Permission to leave phone message Yes  Some recent data might be hidden     Patient questions:  Do you have a fever, pain , or abdominal swelling? No. Pain Score  0 *  Have you tolerated food without any problems? Yes.    Have you been able to return to your normal activities? Yes.    Do you have any questions about your discharge instructions: Diet   No. Medications  No. Follow up visit  No.  Do you have questions or concerns about your Care? No.  Actions: * If pain score is 4 or above: {ACTION; [facility] ENDO PAIN >4:[id number]}

## 2021-07-20 NOTE — Telephone Encounter (Signed)
First post procedure follow up call, left message. °

## 2021-07-20 NOTE — Telephone Encounter (Signed)
Unable to reach patient second follow up call attempt.  LVM

## 2021-07-21 ENCOUNTER — Encounter: Payer: Self-pay | Admitting: Internal Medicine

## 2021-07-21 ENCOUNTER — Telehealth: Payer: Self-pay

## 2021-07-21 ENCOUNTER — Other Ambulatory Visit: Payer: Self-pay

## 2021-07-21 DIAGNOSIS — D509 Iron deficiency anemia, unspecified: Secondary | ICD-10-CM

## 2021-07-21 DIAGNOSIS — Q2730 Arteriovenous malformation, site unspecified: Secondary | ICD-10-CM

## 2021-07-21 NOTE — Telephone Encounter (Signed)
Pt scheduled for previsit 07/23/2021 @ 9:00; EGD scheduled at [facility] on 08/17/2021 @ 11:30. Pt made aware

## 2021-07-23 ENCOUNTER — Other Ambulatory Visit: Payer: Self-pay

## 2021-07-23 ENCOUNTER — Other Ambulatory Visit: Payer: Self-pay | Admitting: Internal Medicine

## 2021-07-23 ENCOUNTER — Ambulatory Visit (AMBULATORY_SURGERY_CENTER): Payer: Self-pay

## 2021-07-23 VITALS — Ht 66.0 in | Wt 168.4 lb

## 2021-07-23 DIAGNOSIS — Q2730 Arteriovenous malformation, site unspecified: Secondary | ICD-10-CM

## 2021-07-23 DIAGNOSIS — Z1231 Encounter for screening mammogram for malignant neoplasm of breast: Secondary | ICD-10-CM

## 2021-07-23 DIAGNOSIS — D509 Iron deficiency anemia, unspecified: Secondary | ICD-10-CM

## 2021-07-23 NOTE — Progress Notes (Signed)
No changes in medical history since last procedure/office visit with MD/procedure; No egg or soy allergy known to patient  No issues known to pt with past sedation with any surgeries or procedures Patient denies ever being told they had issues or difficulty with intubation  No FH of Malignant Hyperthermia Pt is not on diet pills Pt is not on  home 02  Pt is not on blood thinners  Pt denies issues with constipation  No A fib or A flutter  EMMI video to pt or via [place] 19 guidelines implemented in PV today with Pt and RN  Due to the COVID-19 pandemic we are asking patients to follow certain guidelines.  Pt aware of COVID protocols and [place] guidelines  Patient is aware of [facility] procedure arrival time;

## 2021-08-06 ENCOUNTER — Encounter (HOSPITAL_COMMUNITY): Payer: Self-pay | Admitting: Internal Medicine

## 2021-08-06 NOTE — Progress Notes (Signed)
Attempted to obtain medical history via telephone, unable to reach at this time. Attempted to leave voicemail to return pre surgical testing department's phone call, but mailbox was full.

## 2021-08-07 ENCOUNTER — Other Ambulatory Visit: Payer: Self-pay | Admitting: Internal Medicine

## 2021-08-17 ENCOUNTER — Encounter (HOSPITAL_COMMUNITY)
Admission: RE | Disposition: A | Discharge status: Home / Self Care | Payer: Self-pay | Source: Ambulatory Visit | Attending: Internal Medicine

## 2021-08-17 ENCOUNTER — Encounter (HOSPITAL_COMMUNITY): Payer: Self-pay | Admitting: Internal Medicine

## 2021-08-17 ENCOUNTER — Ambulatory Visit (HOSPITAL_COMMUNITY): Payer: Medicare Other | Admitting: Anesthesiology

## 2021-08-17 ENCOUNTER — Other Ambulatory Visit: Payer: Self-pay

## 2021-08-17 ENCOUNTER — Ambulatory Visit (HOSPITAL_COMMUNITY)
Admission: RE | Admit: 2021-08-17 | Discharge: 2021-08-17 | Disposition: A | Discharge status: Home / Self Care | Payer: Medicare Other | Source: Ambulatory Visit | Attending: Internal Medicine | Admitting: Internal Medicine

## 2021-08-17 DIAGNOSIS — Z87891 Personal history of nicotine dependence: Secondary | ICD-10-CM | POA: Diagnosis not present

## 2021-08-17 DIAGNOSIS — Z79899 Other long term (current) drug therapy: Secondary | ICD-10-CM | POA: Diagnosis not present

## 2021-08-17 DIAGNOSIS — Q2730 Arteriovenous malformation, site unspecified: Secondary | ICD-10-CM

## 2021-08-17 DIAGNOSIS — D509 Iron deficiency anemia, unspecified: Secondary | ICD-10-CM

## 2021-08-17 HISTORY — PX: ESOPHAGOGASTRODUODENOSCOPY (EGD) WITH PROPOFOL: SHX5813

## 2021-08-17 SURGERY — ESOPHAGOGASTRODUODENOSCOPY (EGD) WITH PROPOFOL
Anesthesia: Monitor Anesthesia Care

## 2021-08-17 MED ORDER — SODIUM CHLORIDE 0.9 % IV SOLN: INTRAVENOUS | Status: DC

## 2021-08-17 MED ORDER — PROPOFOL 500 MG/50ML IV EMUL
INTRAVENOUS | Status: DC | PRN
  Administered 2021-08-17: 125 ug/kg/min via INTRAVENOUS

## 2021-08-17 MED ORDER — LIDOCAINE 2% (20 MG/ML) 5 ML SYRINGE
INTRAMUSCULAR | Status: DC | PRN
  Administered 2021-08-17: 100 mg via INTRAVENOUS

## 2021-08-17 MED ORDER — PROPOFOL 10 MG/ML IV BOLUS
INTRAVENOUS | Status: DC | PRN
  Administered 2021-08-17: 40 mg via INTRAVENOUS
  Administered 2021-08-17: 20 mg via INTRAVENOUS
  Administered 2021-08-17: 40 mg via INTRAVENOUS

## 2021-08-17 MED ORDER — LACTATED RINGERS IV SOLN
INTRAVENOUS | Status: DC
  Administered 2021-08-17: 500 mL via INTRAVENOUS

## 2021-08-17 SURGICAL SUPPLY — 14 items

## 2021-08-17 NOTE — Discharge Instructions (Signed)

## 2021-08-17 NOTE — Op Note (Signed)
[facility] Patient Name: Erica Potts Procedure Date: 08/17/2021 MRN: [id number] Attending MD: Docia Chuck. Henrene Pastor , MD Date of Birth: [birth year] CSN: [id number] Age: 65 Admit Type: Outpatient Procedure:                Upper GI endoscopy Indications:              Therapeutic procedure, Iron deficiency anemia Providers:                Docia Chuck. Henrene Pastor, MD, Debi Mays, RN, Frazier Richards,                            Technician, Cletis Athens, Technician Referring MD:              Medicines:                Monitored Anesthesia Care Complications:            No immediate complications. Estimated Blood Loss:     Estimated blood loss: none. Procedure:                Pre-Anesthesia Assessment:                           - Prior to the procedure, a History and Physical                            was performed, and patient medications and                            allergies were reviewed. The patient's tolerance of                            previous anesthesia was also reviewed. The risks                            and benefits of the procedure and the sedation                            options and risks were discussed with the patient.                            All questions were answered, and informed consent                            was obtained. Prior Anticoagulants: The patient has                            taken no previous anticoagulant or antiplatelet                            agents. ASA Grade Assessment: II - A patient with                            mild systemic disease. After reviewing the risks  and benefits, the patient was deemed in                            satisfactory condition to undergo the procedure.                           After obtaining informed consent, the endoscope was                            passed under direct vision. Throughout the                            procedure, the patient's blood pressure, pulse, and                             oxygen saturations were monitored continuously. The                            [id number]-H190 ([id number]) Olympus endoscope was introduced                            through the mouth, and advanced to the second part                            of duodenum. The upper GI endoscopy was                            accomplished without difficulty. The patient                            tolerated the procedure well. Findings:      The esophagus was normal.      The stomach was normal. The previously treated area in the fundus was       unremarkable.      The examined duodenum was normal.      The cardia and gastric fundus were normal on retroflexion. Impression:               - Normal esophagus.                           - Normal stomach.                           - Normal examined duodenum.                           - No specimens collected. Moderate Sedation:      none Recommendation:           - Patient has a contact number available for                            emergencies. The signs and symptoms of potential                            delayed complications were discussed with the  patient. Return to normal activities tomorrow.                            Written discharge instructions were provided to the                            patient.                           - Resume previous diet.                           - Continue present medications.                           -Follow-up with Dr. Philip Aspen as planned monitor                            your blood counts and ensure adequate iron                            replacement therapy Procedure Code(s):        --- Professional ---                           [phone], Esophagogastroduodenoscopy, flexible,                            transoral; diagnostic, including collection of                            specimen(s) by brushing or washing, when performed                            (separate  procedure) Diagnosis Code(s):        --- Professional ---                           D50.9, Iron deficiency anemia, unspecified CPT copyright 2019 American Medical Association. All rights reserved. The codes documented in this report are preliminary and upon coder review may  be revised to meet current compliance requirements. Docia Chuck. Henrene Pastor, MD 08/17/2021 12:00:07 PM This report has been signed electronically. Number of Addenda: 0

## 2021-08-17 NOTE — H&P (Signed)
HISTORY OF PRESENT ILLNESS:  Erica Potts is a 65 y.o. female who was recently seen in the office for iron deficiency anemia.  She underwent colonoscopy and upper endoscopy July 16, 2021.  Colonoscopy revealed nonbleeding AVM and small polyp.  Upper endoscopy revealed an area of oozing in the proximal stomach (fundus) which may have represented an AVM.  This was treated with soft coagulation.  She is now for relook upper endoscopy to assess for and possibly treat any residual AVMs.  She does tell me that she has been feeling tired.  She has been taking iron 65 mg daily about 5 or more times per week.  This upsets her stomach.  No frank bleeding.  She is planning on having blood work tomorrow with her PCP, Dr. Philip Aspen regarding her blood counts  REVIEW OF SYSTEMS:  All non-GI ROS negative.  Past Medical History:  Diagnosis Date   Anxiety    Clostridium difficile infection    Colitis    DDD (degenerative disc disease), lumbar    Depression    HLD (hyperlipidemia)    HTN (hypertension)    Insomnia    Melanoma ([place]) 2000's   Ovarian cyst, left    Palpitations    Spinal stenosis     Past Surgical History:  Procedure Laterality Date   BREAST SURGERY     CESAREAN SECTION  1983, 1986   x 2   COLONOSCOPY     DILATION AND CURETTAGE OF UTERUS  2012   LAPAROSCOPIC TOTAL HYSTERECTOMY  2013   MELANOMA EXCISION     MENISCUS REPAIR Left 2011   MOUTH SURGERY Right    maxilary   nipple biopsy     OVARIAN CYST REMOVAL  1990   POLYPECTOMY     TUBAL LIGATION     UPPER GASTROINTESTINAL ENDOSCOPY      Social History Erica Potts  reports that she quit smoking about 29 years ago. Her smoking use included cigarettes. She smoked an average of 1 pack per day. She has never used smokeless tobacco. She reports current alcohol use. She reports that she does not use drugs.  family history includes Colon polyps (age of onset: 60) in her father; Dementia in her father; Esophageal  cancer in her paternal grandfather; Heart attack in her mother; Lung cancer in her paternal grandfather; Prostate cancer in her father.  No Known Allergies     PHYSICAL EXAMINATION:  Vital signs: BP 125/64   Pulse 68   Temp 98.3 F (36.8 C) (Oral)   Resp 18   Ht 5\' 6"  (1.676 m)   Wt 73.9 kg   SpO2 100%   BMI 26.31 kg/m  General: Well-developed, well-nourished, no acute distress HEENT: Sclerae are anicteric, conjunctiva pink. Oral mucosa intact Lungs: Clear Heart: Regular Abdomen: soft, nontender, nondistended, no obvious ascites, no peritoneal signs, normal bowel sounds. No organomegaly. Extremities: No edema Psychiatric: alert and oriented x3. Cooperative     ASSESSMENT:  1.  Colonic AVM 2.  Proximal gastric lesion recently treated.  Query AVM.  Now for reevaluation   PLAN:  1.  Continue iron 2.  Upper endoscopy today 3.  Follow-up with your PCP regarding blood counts and iron levels 4.  May need iron infusion if inadequate replacement.  This can be under the direction of Dr. Philip Aspen.

## 2021-08-17 NOTE — Anesthesia Postprocedure Evaluation (Signed)
Anesthesia Post Note  Patient: Erica Potts  Procedure(s) Performed: ESOPHAGOGASTRODUODENOSCOPY (EGD) WITH PROPOFOL HOT HEMOSTASIS (ARGON PLASMA COAGULATION/BICAP)     Patient location during evaluation: Endoscopy Anesthesia Type: MAC Level of consciousness: awake and alert, patient cooperative and oriented Pain management: pain level controlled Vital Signs Assessment: post-procedure vital signs reviewed and stable Respiratory status: spontaneous breathing, respiratory function stable and nonlabored ventilation Cardiovascular status: blood pressure returned to baseline and stable Postop Assessment: able to ambulate and no apparent nausea or vomiting Anesthetic complications: no   No notable events documented.  Last Vitals:  Vitals:   08/17/21 1210 08/17/21 1220  BP: 128/69 (!) 139/57  Pulse: 71 68  Resp: 17 12  Temp:    SpO2: 100% 100%    Last Pain:  Vitals:   08/17/21 1220  TempSrc:   PainSc: 0-No pain                 Izzah Pasqua,E. Lyell Clugston

## 2021-08-17 NOTE — Transfer of Care (Signed)
Immediate Anesthesia Transfer of Care Note  Patient: Erica Potts  Procedure(s) Performed: ESOPHAGOGASTRODUODENOSCOPY (EGD) WITH PROPOFOL HOT HEMOSTASIS (ARGON PLASMA COAGULATION/BICAP)  Patient Location: Endoscopy Unit  Anesthesia Type:MAC  Level of Consciousness: awake  Airway & Oxygen Therapy: Patient Spontanous Breathing  Post-op Assessment: Report given to RN and Post -op Vital signs reviewed and stable  Post vital signs: Reviewed and stable  Last Vitals:  Vitals Value Taken Time  BP    Temp    Pulse 74 08/17/21 1202  Resp 17 08/17/21 1202  SpO2 100 % 08/17/21 1202  Vitals shown include unvalidated device data.  Last Pain:  Vitals:   08/17/21 1049  TempSrc: Oral  PainSc: 0-No pain         Complications: No notable events documented.

## 2021-08-17 NOTE — Anesthesia Preprocedure Evaluation (Addendum)
Anesthesia Evaluation  Patient identified by MRN, date of birth, ID band Patient awake    Reviewed: Allergy & Precautions, NPO status , Patient's Chart, lab work & pertinent test results  History of Anesthesia Complications Negative for: history of anesthetic complications  Airway Mallampati: II  TM Distance: >3 FB Neck ROM: Full    Dental  (+) Dental Advisory Given   Pulmonary former smoker,    breath sounds clear to auscultation       Cardiovascular hypertension, Pt. on medications (-) angina Rhythm:Regular Rate:Normal     Neuro/Psych Anxiety Depression negative neurological ROS     GI/Hepatic Neg liver ROS, GERD  Medicated and Controlled,  Endo/Other  negative endocrine ROS  Renal/GU negative Renal ROS     Musculoskeletal  (+) Arthritis ,   Abdominal   Peds  Hematology negative hematology ROS (+)   Anesthesia Other Findings   Reproductive/Obstetrics                            Anesthesia Physical Anesthesia Plan  ASA: 2  Anesthesia Plan: MAC   Post-op Pain Management:    Induction: Intravenous  PONV Risk Score and Plan: 2 and Ondansetron and Treatment may vary due to age or medical condition  Airway Management Planned: Natural Airway and Nasal Cannula  Additional Equipment: None  Intra-op Plan:   Post-operative Plan:   Informed Consent: I have reviewed the patients History and Physical, chart, labs and discussed the procedure including the risks, benefits and alternatives for the proposed anesthesia with the patient or authorized representative who has indicated his/her understanding and acceptance.     Dental advisory given  Plan Discussed with: CRNA and Surgeon  Anesthesia Plan Comments:        Anesthesia Quick Evaluation

## 2021-08-19 ENCOUNTER — Encounter (HOSPITAL_COMMUNITY): Payer: Self-pay | Admitting: Internal Medicine

## 2021-08-27 ENCOUNTER — Ambulatory Visit: Payer: Medicare Other

## 2021-08-28 ENCOUNTER — Emergency Department (HOSPITAL_BASED_OUTPATIENT_CLINIC_OR_DEPARTMENT_OTHER)
Admission: EM | Admit: 2021-08-28 | Discharge: 2021-08-28 | Disposition: A | Discharge status: Home / Self Care | Payer: Medicare Other | Attending: Emergency Medicine | Admitting: Emergency Medicine

## 2021-08-28 ENCOUNTER — Other Ambulatory Visit: Payer: Self-pay

## 2021-08-28 ENCOUNTER — Emergency Department (HOSPITAL_BASED_OUTPATIENT_CLINIC_OR_DEPARTMENT_OTHER): Payer: Medicare Other

## 2021-08-28 ENCOUNTER — Emergency Department (HOSPITAL_BASED_OUTPATIENT_CLINIC_OR_DEPARTMENT_OTHER): Payer: Medicare Other | Admitting: Radiology

## 2021-08-28 ENCOUNTER — Other Ambulatory Visit (HOSPITAL_BASED_OUTPATIENT_CLINIC_OR_DEPARTMENT_OTHER): Payer: Self-pay

## 2021-08-28 ENCOUNTER — Encounter (HOSPITAL_BASED_OUTPATIENT_CLINIC_OR_DEPARTMENT_OTHER): Payer: Self-pay | Admitting: Emergency Medicine

## 2021-08-28 DIAGNOSIS — S52591A Other fractures of lower end of right radius, initial encounter for closed fracture: Secondary | ICD-10-CM | POA: Insufficient documentation

## 2021-08-28 DIAGNOSIS — I10 Essential (primary) hypertension: Secondary | ICD-10-CM | POA: Insufficient documentation

## 2021-08-28 DIAGNOSIS — Z23 Encounter for immunization: Secondary | ICD-10-CM | POA: Diagnosis not present

## 2021-08-28 DIAGNOSIS — M25531 Pain in right wrist: Secondary | ICD-10-CM | POA: Insufficient documentation

## 2021-08-28 DIAGNOSIS — W010XXA Fall on same level from slipping, tripping and stumbling without subsequent striking against object, initial encounter: Secondary | ICD-10-CM | POA: Insufficient documentation

## 2021-08-28 DIAGNOSIS — Z7982 Long term (current) use of aspirin: Secondary | ICD-10-CM | POA: Insufficient documentation

## 2021-08-28 DIAGNOSIS — S62101A Fracture of unspecified carpal bone, right wrist, initial encounter for closed fracture: Secondary | ICD-10-CM

## 2021-08-28 DIAGNOSIS — Z87891 Personal history of nicotine dependence: Secondary | ICD-10-CM | POA: Diagnosis not present

## 2021-08-28 DIAGNOSIS — S022XXA Fracture of nasal bones, initial encounter for closed fracture: Secondary | ICD-10-CM | POA: Diagnosis not present

## 2021-08-28 DIAGNOSIS — S022XXB Fracture of nasal bones, initial encounter for open fracture: Secondary | ICD-10-CM

## 2021-08-28 DIAGNOSIS — S0990XA Unspecified injury of head, initial encounter: Secondary | ICD-10-CM | POA: Diagnosis present

## 2021-08-28 MED ORDER — TETANUS-DIPHTH-ACELL PERTUSSIS 5-2.5-18.5 LF-MCG/0.5 IM SUSY
0.5000 mL | PREFILLED_SYRINGE | Freq: Once | INTRAMUSCULAR | Status: AC
  Administered 2021-08-28: 0.5 mL via INTRAMUSCULAR
  Filled 2021-08-28: qty 0.5

## 2021-08-28 MED ORDER — CEPHALEXIN 500 MG PO CAPS: 500.0000 mg | ORAL_CAPSULE | Freq: Three times a day (TID) | ORAL | 0 refills | Status: AC

## 2021-08-28 NOTE — ED Notes (Signed)
She remains awake, alert and comfortable. Her husband remains with her.

## 2021-08-28 NOTE — ED Provider Notes (Signed)
[facility] DEPT Provider Note   CSN: [id number] Arrival date & time: 08/28/21  1121     History Chief Complaint  Patient presents with   Erica Potts    Erica Potts is a 65 y.o. female.  Patient presents after slip and fall.  She says she was getting out of a truck yesterday when she slipped and fell complaining of right knee injury right wrist injury and facial injury.  Denies loss of consciousness.  Denies neck pain or back pain.  She sustained a laceration across her nose which one of her friends in the medical profession sutured for her.  She presents to the ER due to continued pain in the wrist and nose.  She otherwise denies any fevers or cough or vomiting or diarrhea.      Past Medical History:  Diagnosis Date   Anxiety    Clostridium difficile infection    Colitis    DDD (degenerative disc disease), lumbar    Depression    HLD (hyperlipidemia)    HTN (hypertension)    Insomnia    Melanoma ([place]) 2000's   Ovarian cyst, left    Palpitations    Spinal stenosis     Patient Active Problem List   Diagnosis Date Noted   AVM (arteriovenous malformation)    Iron deficiency anemia    Genital herpes simplex 05/26/2021   Hypertensive disorder 05/26/2021    Past Surgical History:  Procedure Laterality Date   BREAST SURGERY     CESAREAN SECTION  1983, 1986   x 2   COLONOSCOPY     DILATION AND CURETTAGE OF UTERUS  2012   ESOPHAGOGASTRODUODENOSCOPY (EGD) WITH PROPOFOL N/A 08/17/2021   Procedure: ESOPHAGOGASTRODUODENOSCOPY (EGD) WITH PROPOFOL;  Surgeon: Irene Shipper, MD;  Location: [facility];  Service: Endoscopy;  Laterality: N/A;   LAPAROSCOPIC TOTAL HYSTERECTOMY  2013   MELANOMA EXCISION     MENISCUS REPAIR Left 2011   MOUTH SURGERY Right    maxilary   nipple biopsy     OVARIAN CYST REMOVAL  1990   POLYPECTOMY     TUBAL LIGATION     UPPER GASTROINTESTINAL ENDOSCOPY       OB History   No obstetric history on file.     Family  History  Problem Relation Age of Onset   Heart attack Mother    Prostate cancer Father    Colon polyps Father 60   Dementia Father    Esophageal cancer Paternal Grandfather    Lung cancer Paternal Grandfather    Colon cancer Neg Hx    Rectal cancer Neg Hx    Stomach cancer Neg Hx    Pancreatic cancer Neg Hx    Liver disease Neg Hx     Social History   Tobacco Use   Smoking status: Former    Packs/day: 1.00    Types: Cigarettes    Quit date: 11/17/1991    Years since quitting: 29.8   Smokeless tobacco: Never  Vaping Use   Vaping Use: Never used  Substance Use Topics   Alcohol use: Yes    Comment: daily   Drug use: No    Home Medications Prior to Admission medications   Medication Sig Start Date End Date Taking? Authorizing Provider  cephALEXin (KEFLEX) 500 MG capsule Take 1 capsule (500 mg total) by mouth 3 (three) times daily for 7 days. 08/28/21 09/04/21 Yes Luna Fuse, MD  ALPRAZolam Duanne Moron) 0.5 MG tablet Take 0.5 mg by mouth at  bedtime as needed for sleep. Take 1/2 -1 tab at bedtime as needed    [provider]  aspirin EC 81 MG tablet Take 81 mg by mouth daily.    [provider]  cyclobenzaprine (FLEXERIL) 10 MG tablet Take 10 mg by mouth at bedtime.    [provider]  docusate sodium (COLACE) 100 MG capsule Take 100 mg by mouth at bedtime.    [provider]  estradiol (VIVELLE-DOT) 0.1 MG/24HR patch Place 1 patch onto the skin 2 (two) times a week. 06/18/21   [provider]  gabapentin (NEURONTIN) 300 MG capsule Take 600 mg by mouth at bedtime. Take two capsules by mouth at bedtime as needed    [provider]  HYDROcodone-acetaminophen (NORCO) 7.5-325 MG per tablet Take 2 tablets by mouth 4 (four) times daily as needed for pain. Patient only taking 2 tablets three times daily    [provider]  lisinopril (PRINIVIL,ZESTRIL) 10 MG tablet Take 10 mg by mouth daily.    [provider]   meloxicam (MOBIC) 15 MG tablet Take 15 mg by mouth daily.    [provider]  Multiple Vitamin (MULTIVITAMIN) tablet Take 1 tablet by mouth daily.    [provider]  Omega-3 Fatty Acids (FISH OIL) 1000 MG CAPS Take 2 capsules by mouth daily.    [provider]  pantoprazole (PROTONIX) 40 MG tablet TAKE 1 TABLET BY MOUTH EVERY DAY 08/09/21   Irene Shipper, MD  predniSONE (DELTASONE) 10 MG tablet Take by mouth daily. Patient not taking: No sig reported 06/01/21   [provider]  saccharomyces boulardii (FLORASTOR) 250 MG capsule Take 500 mg by mouth daily.     [provider]  sertraline (ZOLOFT) 50 MG tablet Take 50 mg by mouth daily.    [provider]  tretinoin (RETIN-A) 0.025 % cream tretinoin 0.025 % topical cream  APPLY TO AFFECTED AREA EVERY DAY AT NIGHT    [provider]  valACYclovir (VALTREX) 1000 MG tablet valacyclovir 1 gram tablet  TAKE 2 TABLETS BY MOUTH TWICE A DAY FOR 3 DAYS    [provider]  VITAMIN D PO Take 2,000 Units by mouth daily.     [provider]  zolpidem (AMBIEN) 10 MG tablet Take 5 mg by mouth at bedtime as needed for sleep.     [provider]    Allergies    Patient has no known allergies.  Review of Systems   Review of Systems  Constitutional:  Negative for fever.  HENT:  Negative for ear pain.   Eyes:  Negative for pain.  Respiratory:  Negative for cough.   Cardiovascular:  Negative for chest pain.  Gastrointestinal:  Negative for abdominal pain.  Genitourinary:  Negative for flank pain.  Musculoskeletal:  Negative for back pain.  Skin:  Negative for rash.  Neurological:  Negative for headaches.   Physical Exam Updated Vital Signs BP 110/77 (BP Location: Right Arm)   Pulse 80   Temp 98.2 F (36.8 C)   Resp 16   Ht 5\' 5"  (1.651 m)   Wt 73.9 kg   SpO2 99%   BMI 27.12 kg/m   Physical Exam Constitutional:      General: She is not in acute  distress.    Appearance: Normal appearance.  HENT:     Head: Normocephalic.     Nose: Nose normal.  Eyes:     Extraocular Movements: Extraocular movements intact.  Cardiovascular:  Rate and Rhythm: Normal rate.  Pulmonary:     Effort: Pulmonary effort is normal.  Musculoskeletal:        General: Normal range of motion.     Cervical back: Normal range of motion.     Comments: Swelling and tenderness to the right wrist and dorsum of the hand.  Otherwise ranging bilateral knees with no pain or discomfort.  Mild abrasion seen on the right kneecap.  Skin:    Comments: Calf pain 1 cm laceration across bridge of the nose which appears suture line in the healing process.  No signs of purulence erythema or early infection.  Neurological:     General: No focal deficit present.     Mental Status: She is alert. Mental status is at baseline.    ED Results / Procedures / Treatments   Labs (all labs ordered are listed, but only abnormal results are displayed) Labs Reviewed - No data to display  EKG None  Radiology DG Wrist Complete Right  Result Date: 08/28/2021 CLINICAL DATA:  Wrist pain after fall last night EXAM: RIGHT WRIST - COMPLETE 3+ VIEW COMPARISON:  None. FINDINGS: A small cortical fragment is seen along the distal aspect of the radius near the distal radioulnar joint. It is unclear if this fragment is an acute or chronic fracture. There is no evidence of joint dislocation. Mild degenerative changes are seen in the wrist and hand. There is surrounding soft tissue swelling of the wrist. IMPRESSION: Age indeterminate cortical fragment along the distal aspect of the radius near the distal radioulnar joint. Electronically Signed   By: Zerita Boers M.D.   On: 08/28/2021 12:50   CT Head Wo Contrast  Result Date: 08/28/2021 CLINICAL DATA:  A 65 year old female presents with maxillofacial pain, laceration across nose. Also with reported bruising about the LEFT orbit. EXAM: CT HEAD  WITHOUT CONTRAST CT MAXILLOFACIAL WITHOUT CONTRAST TECHNIQUE: Multidetector CT imaging of the head and maxillofacial structures were performed using the standard protocol without intravenous contrast. Multiplanar CT image reconstructions of the maxillofacial structures were also generated. COMPARISON:  None FINDINGS: CT HEAD FINDINGS Brain: No evidence of acute infarction, hemorrhage, hydrocephalus, extra-axial collection or mass lesion/mass effect. Signs of mild generalized atrophy Vascular: No hyperdense vessel or unexpected calcification. Skull: Normal. Negative for fracture or focal lesion. Other: None aside from minimal soft tissue swelling along the medial LEFT preseptal soft tissues about the LEFT orbit. See face CT. CT MAXILLOFACIAL FINDINGS Osseous: Minimally displaced RIGHT nasal bone fracture, tiny fracture just off the midline suspected. Uncertain chronicity. No orbital fracture. No mandibular fracture or other fracture identified. Orbits: Mild infraorbital stranding on the LEFT suspected. Small amount of gas in the subcutaneous soft tissues of the bridge of the nose likely related to reported laceration in this location. Sinuses: No air-fluid levels in the sinuses.  No mucosal thickening. Soft tissues: As above IMPRESSION: No acute intracranial abnormality.  With mild cerebral atrophy. Minimally displaced RIGHT nasal bone fracture, tiny fracture just off the midline suspected. Potential minimally displaced fracture of the RIGHT nasal bone. Otherwise negative evaluation of the bony structures of the face. Mild soft tissue swelling along the medial LEFT preseptal soft tissues about the LEFT orbit. Mild infraorbital stranding on the LEFT suspected. Small amount of gas in the subcutaneous soft tissues of the bridge of the nose likely related to reported laceration in this location. Electronically Signed   By: Zetta Bills M.D.   On: 08/28/2021 13:53   CT Maxillofacial Wo Contrast  Result Date:  08/28/2021 CLINICAL DATA:  A 65 year old female presents with maxillofacial pain, laceration across nose. Also with reported bruising about the LEFT orbit. EXAM: CT HEAD WITHOUT CONTRAST CT MAXILLOFACIAL WITHOUT CONTRAST TECHNIQUE: Multidetector CT imaging of the head and maxillofacial structures were performed using the standard protocol without intravenous contrast. Multiplanar CT image reconstructions of the maxillofacial structures were also generated. COMPARISON:  None FINDINGS: CT HEAD FINDINGS Brain: No evidence of acute infarction, hemorrhage, hydrocephalus, extra-axial collection or mass lesion/mass effect. Signs of mild generalized atrophy Vascular: No hyperdense vessel or unexpected calcification. Skull: Normal. Negative for fracture or focal lesion. Other: None aside from minimal soft tissue swelling along the medial LEFT preseptal soft tissues about the LEFT orbit. See face CT. CT MAXILLOFACIAL FINDINGS Osseous: Minimally displaced RIGHT nasal bone fracture, tiny fracture just off the midline suspected. Uncertain chronicity. No orbital fracture. No mandibular fracture or other fracture identified. Orbits: Mild infraorbital stranding on the LEFT suspected. Small amount of gas in the subcutaneous soft tissues of the bridge of the nose likely related to reported laceration in this location. Sinuses: No air-fluid levels in the sinuses.  No mucosal thickening. Soft tissues: As above IMPRESSION: No acute intracranial abnormality.  With mild cerebral atrophy. Minimally displaced RIGHT nasal bone fracture, tiny fracture just off the midline suspected. Potential minimally displaced fracture of the RIGHT nasal bone. Otherwise negative evaluation of the bony structures of the face. Mild soft tissue swelling along the medial LEFT preseptal soft tissues about the LEFT orbit. Mild infraorbital stranding on the LEFT suspected. Small amount of gas in the subcutaneous soft tissues of the bridge of the nose likely  related to reported laceration in this location. Electronically Signed   By: Zetta Bills M.D.   On: 08/28/2021 13:53    Procedures .Ortho Injury Treatment  Date/Time: 08/28/2021 2:07 PM Performed by: Luna Fuse, MD Authorized by: Luna Fuse, MD  Post-procedure neurovascular assessment: post-procedure neurovascularly intact Comments: Right wrist placed in a wrist splint.  Neurovascular intact after placement.  No snuffbox tenderness noted.     Medications Ordered in ED Medications  Tdap (BOOSTRIX) injection 0.5 mL (0.5 mLs Intramuscular Given 08/28/21 1312)    ED Course  I have reviewed the triage vital signs and the nursing notes.  Pertinent labs & imaging results that were available during my care of the patient were reviewed by me and considered in my medical decision making (see chart for details).    MDM Rules/Calculators/A&P                           X-ray confirms age-indeterminate fracture of the right distal radius.  No snuffbox tenderness noted.  Also noted nasal fractures.  No intracranial pathology noted.  Patient updated on tetanus, declined other pain medications, will be discharged home for outpatient follow-up.  Advise follow-up with ENT within the week.  Advising immediate return for worsening pain fevers or any additional concerns.  Given prescription of antibiotics given nasal fracture with laceration across bridge of the nose.  Final Clinical Impression(s) / ED Diagnoses Final diagnoses:  Open fracture of nasal bone, initial encounter  Closed fracture of right wrist, initial encounter    Rx / DC Orders ED Discharge Orders          Ordered    cephALEXin (KEFLEX) 500 MG capsule  3 times daily        08/28/21 1407  Luna Fuse, MD 08/28/21 [phone]

## 2021-08-28 NOTE — Discharge Instructions (Signed)
Call your primary care doctor or specialist as discussed in the next 2-3 days.   Return immediately back to the ER if:  Your symptoms worsen within the next 12-24 hours. You develop new symptoms such as new fevers, persistent vomiting, new pain, shortness of breath, or new weakness or numbness, or if you have any other concerns.  

## 2021-08-28 NOTE — ED Triage Notes (Addendum)
Pt slipped while getting out of a lifted truck. C/0 R knee, R wrist, facial and head pain. Her PA friend sutured a laceration on her nose last night. Bruising noted to R wrist and around L eye. Denies LOC. She takes ASA

## 2021-09-24 ENCOUNTER — Ambulatory Visit: Payer: Medicare Other

## 2021-10-25 ENCOUNTER — Ambulatory Visit
Admission: RE | Admit: 2021-10-25 | Discharge: 2021-10-25 | Disposition: A | Discharge status: Home / Self Care | Payer: Medicare Other | Source: Ambulatory Visit | Attending: Internal Medicine | Admitting: Internal Medicine

## 2021-10-25 DIAGNOSIS — Z1231 Encounter for screening mammogram for malignant neoplasm of breast: Secondary | ICD-10-CM

## 2021-10-27 ENCOUNTER — Other Ambulatory Visit: Payer: Self-pay | Admitting: Internal Medicine

## 2021-10-27 DIAGNOSIS — R928 Other abnormal and inconclusive findings on diagnostic imaging of breast: Secondary | ICD-10-CM

## 2021-12-13 ENCOUNTER — Ambulatory Visit
Admission: RE | Admit: 2021-12-13 | Discharge: 2021-12-13 | Disposition: A | Discharge status: Home / Self Care | Payer: Medicare Other | Source: Ambulatory Visit | Attending: Internal Medicine | Admitting: Internal Medicine

## 2021-12-13 ENCOUNTER — Other Ambulatory Visit: Payer: Self-pay | Admitting: Internal Medicine

## 2021-12-13 ENCOUNTER — Other Ambulatory Visit: Payer: Self-pay

## 2021-12-13 DIAGNOSIS — R928 Other abnormal and inconclusive findings on diagnostic imaging of breast: Secondary | ICD-10-CM

## 2021-12-21 ENCOUNTER — Other Ambulatory Visit: Payer: Self-pay

## 2021-12-21 ENCOUNTER — Ambulatory Visit (INDEPENDENT_AMBULATORY_CARE_PROVIDER_SITE_OTHER): Payer: Medicare Other | Admitting: Nurse Practitioner

## 2021-12-21 ENCOUNTER — Encounter: Payer: Self-pay | Admitting: Nurse Practitioner

## 2021-12-21 VITALS — BP 100/68 | HR 104 | Ht 64.75 in | Wt 162.2 lb

## 2021-12-21 DIAGNOSIS — K5520 Angiodysplasia of colon without hemorrhage: Secondary | ICD-10-CM | POA: Insufficient documentation

## 2021-12-21 DIAGNOSIS — D509 Iron deficiency anemia, unspecified: Secondary | ICD-10-CM | POA: Diagnosis not present

## 2021-12-21 DIAGNOSIS — D649 Anemia, unspecified: Secondary | ICD-10-CM | POA: Insufficient documentation

## 2021-12-21 DIAGNOSIS — Z87898 Personal history of other specified conditions: Secondary | ICD-10-CM | POA: Insufficient documentation

## 2021-12-21 NOTE — Progress Notes (Signed)
Excellent comprehensive evaluation reviewed. Erica Potts, I do not think she needs repeat endoscopic assessment without significant GI bleeding.  Agree with hematology assessment as she may benefit from IV iron replacement and serial monitoring

## 2021-12-21 NOTE — Patient Instructions (Signed)
RECOMMENDATION(S):  We will contact you for further recommendations after talking with Dr. Henrene Pastor.    BMI:  If you are age 66 or older, your body mass index should be between 23-30. Your Body mass index is 27.21 kg/m. If this is out of the aforementioned range listed, please consider follow up with your Primary Care Provider.  If you are age 88 or younger, your body mass index should be between 19-25. Your Body mass index is 27.21 kg/m. If this is out of the aformentioned range listed, please consider follow up with your Primary Care Provider.   MY CHART:  The [facility] providers would like to encourage you to use [facility] to communicate with providers for non-urgent requests or questions.  Due to long hold times on the telephone, sending your provider a message by [facility] may be a faster and more efficient way to get a response.  Please allow 48 business hours for a response.  Please remember that this is for non-urgent requests.   Thank you for trusting me with your gastrointestinal care!    Tye Savoy, NP

## 2021-12-21 NOTE — Progress Notes (Signed)
ASSESSMENT AND PLAN    # 66 yo female with chronic iron deficiency anemia in setting of daily Meloxicam. She has a history of a bleeding gastric AMV and a non-bleeding colon AVM in September 2022. Follow up EGD in October 2022 was normal.  Remains iron deficient despite chronic oral iron. Hgb 11.9, ferritin 16. Stools dark on iron. Heme negative in clinic today. May have intermittent GI bleeding from NSAID related mucosal injury secondary to NSAID vrs intermittent AVM bleeding. Additionally, not sure if this is just persistent IDA from when we say her in September 2022 and iron stores just haven't been adequately repleted.  --Will discuss with Dr. Henrene Pastor if she needs repeat / additional endoscopic evaluation for persistent IDA. Also, may benefit from Hematology referral for IV iron.  --Continue BID PPI for now. Unfortunately she will find if difficult to stop anti-inflammatories ( currently taking Meloxicam) --Polyp surveillance colonoscopy due September 2029   HISTORY OF PRESENT ILLNESS     Chief Complaint : anemia  Erica Potts is a 66 y.o. female with a past medical history significant for IDA, gastric and colonic AVMs, NSAID induced PUD in 2014,  candida esophagitis, C-difficile, adenomatous colon polyps, diverticulosis, HTN,  anxiety. See PMH below for any additional history.   Patient known to Dr. Henrene Pastor. She  is sent by PCP Reginold Agent, NP ( [place]) for evaluation of IDA.   History of Iron Deficiency Anemia:  Patient had  an EGD and colonoscopy in September 2022 for new IDA ( Dr. Henrene Pastor).  EGD showed an oozing gastric AVMs treated with cautery. She had a non-bleeding colon AVM. A small colon polyp was removed. Follow up EGD in October 2022 was normal.   On 11/29/21 she saw PCP with complaints of fatigue. Hgb was 11.9 ( ? Apparently down from previous labs), ferritin 16, TIBC 405, iron sat 5%, platelets 661. PCP increased PPI to BID, changed her from  OTC iron to prescription strength and referred back to [place].  Her stools are dark on oral iron for the last year or so.  No red blood in stools.  Erica Maryland takes Meloxicam on a daily basis. She broke her wrist  in October and also needs it for back pain.  She has no abdominal pain, nausea or vomiting.   PREVIOUS GI EVALUATIONS:   Colonoscopy Sept 2022 for IDA --One 3 mm polyp in the sigmoid colon, removed with a cold snare. Resected and retrieved. - A single non-bleeding colonic angiodysplastic lesion. This can cause Hemoccult positive stool. - Diverticulosis in the left colon. --Polyp path - tubular adenoma  EGD Sept 2022  The esophagus was normal. - The stomach revealed pooled hematin in the fundus. Evaluation of this area revealed a friable superficial vascular lesion with oozing. Likely AVM. This was coagulated with soft tip cautery. Oozing ceased. - The examined duodenum was normal. - The cardia and gastric fundus were normal on retroflexion.  Past Medical History:  Diagnosis Date   Anxiety    Clostridium difficile infection    Colitis    DDD (degenerative disc disease), lumbar    Depression    HLD (hyperlipidemia)    HTN (hypertension)    IDA (iron deficiency anemia)    Insomnia    Melanoma ([place]) 2000's   Ovarian cyst, left    Palpitations    Spinal stenosis      Past Surgical History:  Procedure Laterality Date   BREAST SURGERY  [place]   x 2   COLONOSCOPY     DILATION AND CURETTAGE OF UTERUS  2012   ESOPHAGOGASTRODUODENOSCOPY (EGD) WITH PROPOFOL N/A 08/17/2021   Procedure: ESOPHAGOGASTRODUODENOSCOPY (EGD) WITH PROPOFOL;  Surgeon: Irene Shipper, MD;  Location: [facility];  Service: Endoscopy;  Laterality: N/A;   LAPAROSCOPIC TOTAL HYSTERECTOMY  2013   MELANOMA EXCISION     MENISCUS REPAIR Left 2011   MOUTH SURGERY Right    maxilary   nipple biopsy     OVARIAN CYST REMOVAL  1990   POLYPECTOMY     TUBAL LIGATION     UPPER GASTROINTESTINAL  ENDOSCOPY     Family History  Problem Relation Age of Onset   Heart attack Mother    Prostate cancer Father    Colon polyps Father 44   Dementia Father    Esophageal cancer Paternal Grandfather    Lung cancer Paternal Grandfather    Colon cancer Neg Hx    Rectal cancer Neg Hx    Stomach cancer Neg Hx    Pancreatic cancer Neg Hx    Liver disease Neg Hx    Social History   Tobacco Use   Smoking status: Former    Packs/day: 1.00    Types: Cigarettes    Quit date: 11/17/1991    Years since quitting: 30.1   Smokeless tobacco: Never  Vaping Use   Vaping Use: Never used  Substance Use Topics   Alcohol use: Yes    Comment: daily   Drug use: No   Current Outpatient Medications  Medication Sig Dispense Refill   ALPRAZolam (XANAX) 0.5 MG tablet Take 0.5 mg by mouth at bedtime as needed for sleep. Take 1/2 -1 tab at bedtime as needed     aspirin EC 81 MG tablet Take 81 mg by mouth daily.     buPROPion (WELLBUTRIN SR) 150 MG 12 hr tablet TAKE 1 TABLET BY MOUTH EVERY DAY IN THE MORNING FOR 30 DAYS     cyclobenzaprine (FLEXERIL) 10 MG tablet Take 10 mg by mouth at bedtime.     docusate sodium (COLACE) 100 MG capsule Take 100 mg by mouth 2 (two) times daily.     estradiol (VIVELLE-DOT) 0.1 MG/24HR patch Place 1 patch onto the skin 2 (two) times a week.     Ferrous Sulfate (IRON PO) Take 30 mg by mouth at bedtime. Pt stated samples given of 30 mg tablet     gabapentin (NEURONTIN) 300 MG capsule Take 600 mg by mouth at bedtime. Take two capsules by mouth at bedtime as needed     HYDROcodone-acetaminophen (NORCO) 7.5-325 MG per tablet Take 2 tablets by mouth 3 (three) times daily as needed for pain. Patient only taking 2 tablets three times daily     lisinopril (PRINIVIL,ZESTRIL) 10 MG tablet Take 10 mg by mouth daily.     meloxicam (MOBIC) 15 MG tablet Take 15 mg by mouth daily.     Multiple Vitamin (MULTIVITAMIN) tablet Take 1 tablet by mouth daily.     Omega-3 Fatty Acids (FISH OIL) 1000  MG CAPS Take 2 capsules by mouth daily.     pantoprazole (PROTONIX) 40 MG tablet TAKE 1 TABLET BY MOUTH EVERY DAY 90 tablet 1   pantoprazole (PROTONIX) 40 MG tablet 40 mg 2 (two) times daily.     saccharomyces boulardii (FLORASTOR) 250 MG capsule Take 500 mg by mouth daily.      sertraline (ZOLOFT) 50 MG tablet Take 25 mg by  mouth daily.     sucralfate (CARAFATE) 1 g tablet Take 1 g by mouth 2 (two) times daily.     tretinoin (RETIN-A) 0.025 % cream tretinoin 0.025 % topical cream  APPLY TO AFFECTED AREA EVERY DAY AT NIGHT     VITAMIN D PO Take 2,000 Units by mouth daily.      zolpidem (AMBIEN) 10 MG tablet Take 5 mg by mouth at bedtime as needed for sleep.      No current facility-administered medications for this visit.   No Known Allergies   Review of Systems: Positive for fatigue. No chest pain, no shortness of breath. All other systems reviewed and negative except where noted in HPI.    PHYSICAL EXAM :    Wt Readings from Last 3 Encounters:  12/21/21 162 lb 4 oz (73.6 kg)  08/28/21 163 lb (73.9 kg)  08/17/21 163 lb (73.9 kg)    BP 100/68 (BP Location: Left Arm, Patient Position: Sitting, Cuff Size: Normal)    Pulse (!) 104    Ht 5' 4.75" (1.645 m) Comment: height measured without shoes   Wt 162 lb 4 oz (73.6 kg)    BMI 27.21 kg/m  Constitutional:  Generally well appearing female in no acute distress. Psychiatric: Pleasant. Normal mood and affect. Behavior is normal. EENT: Pupils normal.  Conjunctivae are normal. No scleral icterus. Neck supple.  Cardiovascular: Normal rate, regular rhythm. No edema Pulmonary/chest: Effort normal and breath sounds normal. No wheezing, rales or rhonchi. Abdominal: Soft, nondistended, nontender. Bowel sounds active throughout. There are no masses palpable. No hepatomegaly. Rectal: scan brown, heme negative stool in vault Neurological: Alert and oriented to person place and time. Skin: Skin is warm and dry. No rashes noted.  Erica Savoy,  NP  12/21/2021, 9:13 AM  I spent 30 minutes total reviewing records, obtaining history, performing exam, counseling patient and documenting visit / findings.   Cc:  Reginold Agent, NP

## 2021-12-28 ENCOUNTER — Other Ambulatory Visit: Payer: Self-pay

## 2021-12-28 ENCOUNTER — Telehealth: Payer: Self-pay

## 2021-12-28 DIAGNOSIS — D509 Iron deficiency anemia, unspecified: Secondary | ICD-10-CM

## 2021-12-28 NOTE — Telephone Encounter (Signed)
-----   Message from Willia Craze, NP sent at 12/21/2021  5:22 PM EST ----- Erica Potts, Please let patient know that I discussed her case with Dr. Henrene Pastor.  In the absence of obvious bleeding he does not feel she needs a repeat endoscopic evaluation at this time.  He does agree that IV iron would probably be more beneficial than oral iron.  Would you please make a referral to hematology for evaluation and treatment of iron deficiency anemia.  Thank you

## 2021-12-28 NOTE — Telephone Encounter (Signed)
Patient contacted. Agrees to this plan of care. Referral placed.

## 2021-12-31 ENCOUNTER — Telehealth: Payer: Self-pay | Admitting: Physician Assistant

## 2021-12-31 NOTE — Telephone Encounter (Signed)
Scheduled appt per 2/21 referral. Pt is aware of appt date and time. Pt is aware to arrive 15 mins prior to appt time and to bring and updated insurance card. Pt is aware of appt location.   ?

## 2022-01-21 ENCOUNTER — Other Ambulatory Visit: Payer: Self-pay

## 2022-01-21 DIAGNOSIS — D509 Iron deficiency anemia, unspecified: Secondary | ICD-10-CM

## 2022-01-21 DIAGNOSIS — R634 Abnormal weight loss: Secondary | ICD-10-CM

## 2022-01-22 NOTE — Progress Notes (Deleted)
? ? [place] ?[phone]   [phone] ? ?CONSULT NOTE ? ?REFERRING PHYSICIAN: Tye Savoy  ? ?REASON FOR CONSULTATION:  ?Iron Deficiency Anemia ? ?HPI ?Erica Potts is a 66 y.o. female with a past medical history significant for AVN, hypercholesterolemia, and hypertension, and chronic back pain?  Is referred to the clinic for iron deficiency anemia. ? ?The patient's gastroenterology office on 12/21/2021.  The patient has a history of GI blood loss from AVMs.  The patient had a EGD and colonoscopy in September 2022 for new onset iron deficiency anemia.  She is followed by Dr. Henrene Pastor.  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  She also had a nonbleeding AVM in the colon.  She had a follow-up EGD in October 2022 which was normal.  The patient saw her PCP on 11/29/2021 with chief complaint of fatigue.  She had mild anemia with a hemoglobin of 11.9 and persistent low ferritin and low iron saturation.  She also had a reactive thrombocytosis.  Her PCP increase her PPI and recommended prescription strength IV iron and she was referred back to gastroenterology.  She reportedly can continues to have dark stool since she has been taking iron.  Denies any bright red bleeding in the stool.  She also takes meloxicam on a daily basis for wrist and back pain. ? ?She had Hemoccult testing which was negative performed.  In the absence of any obvious bleeding, her gastroenterologist did not feel that she needed repeat endoscopic evaluation.  She was referred to the clinic for consideration of IV iron. ? ?Per chart review, the only other CBC that I have is from April 2021 in which the patient did not have any anemia at that time although she did have some thrombocytosis with a platelet count of 451.  Therefore, the anemia is new.  She denies any bariatric surgery.  Denies any other history of vitamin deficiencies.  Denies ever needing a blood or iron  infusion/transfusion.  Denies any dyspnea on exertion, lightheadedness, chest pain, or palpitations.  She reports fatigue.  Denies any vaginal bleeding.  Denies any epistaxis, gingival bleeding, hemoptysis, hematemesis, or hematuria.  She has dark stools which may be attributed to her iron supplement.  The patient is not on any blood thinners except for?  Aspirin?  She denies any NSAID use.  She denies any ice cravings.  Denies any particular dietary habits such as being a vegan or vegetarian.  Denies any history of kidney disease. ?HPI ? ?Past Medical History:  ?Diagnosis Date  ? Anxiety   ? Clostridium difficile infection   ? Colitis   ? DDD (degenerative disc disease), lumbar   ? Depression   ? HLD (hyperlipidemia)   ? HTN (hypertension)   ? IDA (iron deficiency anemia)   ? Insomnia   ? Melanoma ([place]) 2000's  ? Ovarian cyst, left   ? Palpitations   ? Spinal stenosis   ? ? ?Past Surgical History:  ?Procedure Laterality Date  ? BREAST SURGERY    ? [place]  ? x 2  ? COLONOSCOPY    ? DILATION AND CURETTAGE OF UTERUS  2012  ? ESOPHAGOGASTRODUODENOSCOPY (EGD) WITH PROPOFOL N/A 08/17/2021  ? Procedure: ESOPHAGOGASTRODUODENOSCOPY (EGD) WITH PROPOFOL;  Surgeon: Irene Shipper, MD;  Location: [facility];  Service: Endoscopy;  Laterality: N/A;  ? LAPAROSCOPIC TOTAL HYSTERECTOMY  2013  ? MELANOMA EXCISION    ? MENISCUS REPAIR Left 2011  ?  MOUTH SURGERY Right   ? maxilary  ? nipple biopsy    ? OVARIAN CYST REMOVAL  1990  ? POLYPECTOMY    ? TUBAL LIGATION    ? UPPER GASTROINTESTINAL ENDOSCOPY    ? ? ?Family History  ?Problem Relation Age of Onset  ? Heart attack Mother   ? Prostate cancer Father   ? Colon polyps Father 80  ? Dementia Father   ? Esophageal cancer Paternal Grandfather   ? Lung cancer Paternal Grandfather   ? Colon cancer Neg Hx   ? Rectal cancer Neg Hx   ? Stomach cancer Neg Hx   ? Pancreatic cancer Neg Hx   ? Liver disease Neg Hx   ? ? ?Social History ?Social History  ? ?Tobacco Use  ?  Smoking status: Former  ?  Packs/day: 1.00  ?  Types: Cigarettes  ?  Quit date: 11/17/1991  ?  Years since quitting: 30.2  ? Smokeless tobacco: Never  ?Vaping Use  ? Vaping Use: Never used  ?Substance Use Topics  ? Alcohol use: Yes  ?  Comment: daily  ? Drug use: No  ? ? ?No Known Allergies ? ?Current Outpatient Medications  ?Medication Sig Dispense Refill  ? ALPRAZolam (XANAX) 0.5 MG tablet Take 0.5 mg by mouth at bedtime as needed for sleep. Take 1/2 -1 tab at bedtime as needed    ? aspirin EC 81 MG tablet Take 81 mg by mouth daily.    ? buPROPion (WELLBUTRIN SR) 150 MG 12 hr tablet TAKE 1 TABLET BY MOUTH EVERY DAY IN THE MORNING FOR 30 DAYS    ? cyclobenzaprine (FLEXERIL) 10 MG tablet Take 10 mg by mouth at bedtime.    ? docusate sodium (COLACE) 100 MG capsule Take 100 mg by mouth 2 (two) times daily.    ? estradiol (VIVELLE-DOT) 0.1 MG/24HR patch Place 1 patch onto the skin 2 (two) times a week.    ? Ferrous Sulfate (IRON PO) Take 30 mg by mouth at bedtime. Pt stated samples given of 30 mg tablet    ? gabapentin (NEURONTIN) 300 MG capsule Take 600 mg by mouth at bedtime. Take two capsules by mouth at bedtime as needed    ? HYDROcodone-acetaminophen (NORCO) 7.5-325 MG per tablet Take 2 tablets by mouth 3 (three) times daily as needed for pain. Patient only taking 2 tablets three times daily    ? lisinopril (PRINIVIL,ZESTRIL) 10 MG tablet Take 10 mg by mouth daily.    ? meloxicam (MOBIC) 15 MG tablet Take 15 mg by mouth daily.    ? Multiple Vitamin (MULTIVITAMIN) tablet Take 1 tablet by mouth daily.    ? Omega-3 Fatty Acids (FISH OIL) 1000 MG CAPS Take 2 capsules by mouth daily.    ? pantoprazole (PROTONIX) 40 MG tablet TAKE 1 TABLET BY MOUTH EVERY DAY 90 tablet 1  ? pantoprazole (PROTONIX) 40 MG tablet 40 mg 2 (two) times daily.    ? saccharomyces boulardii (FLORASTOR) 250 MG capsule Take 500 mg by mouth daily.     ? sertraline (ZOLOFT) 50 MG tablet Take 25 mg by mouth daily.    ? sucralfate (CARAFATE) 1 g tablet  Take 1 g by mouth 2 (two) times daily.    ? tretinoin (RETIN-A) 0.025 % cream tretinoin 0.025 % topical cream ? APPLY TO AFFECTED AREA EVERY DAY AT NIGHT    ? VITAMIN D PO Take 2,000 Units by mouth daily.     ? zolpidem (AMBIEN) 10 MG tablet  Take 5 mg by mouth at bedtime as needed for sleep.     ? ?No current facility-administered medications for this visit.  ? ? ?REVIEW OF SYSTEMS:   ?Review of Systems  ?Constitutional: Negative for appetite change, chills, fatigue, fever and unexpected weight change.  ?HENT:   Negative for mouth sores, nosebleeds, sore throat and trouble swallowing.   ?Eyes: Negative for eye problems and icterus.  ?Respiratory: Negative for cough, hemoptysis, shortness of breath and wheezing.   ?Cardiovascular: Negative for chest pain and leg swelling.  ?Gastrointestinal: Negative for abdominal pain, constipation, diarrhea, nausea and vomiting.  ?Genitourinary: Negative for bladder incontinence, difficulty urinating, dysuria, frequency and hematuria.   ?Musculoskeletal: Negative for back pain, gait problem, neck pain and neck stiffness.  ?Skin: Negative for itching and rash.  ?Neurological: Negative for dizziness, extremity weakness, gait problem, headaches, light-headedness and seizures.  ?Hematological: Negative for adenopathy. Does not bruise/bleed easily.  ?Psychiatric/Behavioral: Negative for confusion, depression and sleep disturbance. The patient is not nervous/anxious.   ? ? ?PHYSICAL EXAMINATION:  ?There were no vitals taken for this visit. ? ?ECOG PERFORMANCE STATUS: {CHL ONC ECOG SA:[id number]} ? ?Physical Exam  ?Constitutional: Oriented to person, place, and time and well-developed, well-nourished, and in no distress. No distress.  ?HENT:  ?Head: Normocephalic and atraumatic.  ?Mouth/Throat: Oropharynx is clear and moist. No oropharyngeal exudate.  ?Eyes: Conjunctivae are normal. Right eye exhibits no discharge. Left eye exhibits no discharge. No scleral icterus.  ?Neck: Normal range  of motion. Neck supple.  ?Cardiovascular: Normal rate, regular rhythm, normal heart sounds and intact distal pulses.   ?Pulmonary/Chest: Effort normal and breath sounds normal. No respiratory distress. No wheezes.

## 2022-01-24 ENCOUNTER — Inpatient Hospital Stay: Payer: Medicare Other | Admitting: Physician Assistant

## 2022-01-24 ENCOUNTER — Inpatient Hospital Stay: Payer: Medicare Other

## 2022-01-26 ENCOUNTER — Telehealth: Payer: Self-pay | Admitting: Physician Assistant

## 2022-01-26 NOTE — Telephone Encounter (Signed)
R/s pt's cancelled new hem appt. Pt is aware of new appt date and time.  ?

## 2022-02-10 NOTE — Progress Notes (Signed)
?[place] ?[phone]   [phone] ? ?INITIAL CONSULT NOTE ? ?Patient Care Team: ?Donnajean Lopes, MD as PCP - General (Internal Medicine) ? ?CHIEF COMPLAINTS/PURPOSE OF CONSULTATION:  ?Iron deficiency anemia ? ?HISTORY OF PRESENTING ILLNESS:  ?Erica Potts 66 y.o. female with medical history significant for hyperlipidemia, hypertension, DDD, anxiety, depression and melanoma. She is unaccompanied for this visit.  ? ?On review of the previous records, Ms. Ron is under the care of gastroenterologist, Dr. Henrene Pastor for history of bleeding gastric AVM and non bleeding colon AVM seen in endoscopic evaluation this past September 2022. Additionally, patient has history of NSAID induced PUD in 2014. Most recent outside labs from 11/29/2021 showed Hgb was 11.9, ferritin 16, TIBC 405, iron saturation 5% and platelet 661.  ? ?On exam today, Ms. Pesantez reports ongoing fatigue but adds that her depression due to a family member's death contributed to the fatigue. She reports improvement of her energy levels since starting on iron pills and antidepressant. She denies any dietary restrictions or appetite changes. She denies nausea, vomiting or abdominal pain. She reports having constipation due to iron pills and hydrocodone pain medication. She takes colace which has improved her contipation. She has a bowel movement every other day. She denies any easy bruising or signs of active bleeding. She denies fevers, chills, night sweats, shortness of breath, chest pain or cough. She has no other complaints. Rest of the 10 point ROS is below.  ? ?MEDICAL HISTORY:  ?Past Medical History:  ?Diagnosis Date  ? Anxiety   ? Clostridium difficile infection   ? Colitis   ? DDD (degenerative disc disease), lumbar   ? Depression   ? HLD (hyperlipidemia)   ? HTN (hypertension)   ? IDA (iron deficiency anemia)   ? Insomnia   ? Melanoma ([place]) 2000's  ? Ovarian cyst, left   ? Palpitations   ? Spinal  stenosis   ? ? ?SURGICAL HISTORY: ?Past Surgical History:  ?Procedure Laterality Date  ? BREAST SURGERY    ? [place]  ? x 2  ? COLONOSCOPY    ? DILATION AND CURETTAGE OF UTERUS  2012  ? ESOPHAGOGASTRODUODENOSCOPY (EGD) WITH PROPOFOL N/A 08/17/2021  ? Procedure: ESOPHAGOGASTRODUODENOSCOPY (EGD) WITH PROPOFOL;  Surgeon: Press Casale Shipper, MD;  Location: [facility];  Service: Endoscopy;  Laterality: N/A;  ? LAPAROSCOPIC TOTAL HYSTERECTOMY  2013  ? MELANOMA EXCISION    ? MENISCUS REPAIR Left 2011  ? MOUTH SURGERY Right   ? maxilary  ? nipple biopsy    ? OVARIAN CYST REMOVAL  1990  ? POLYPECTOMY    ? TUBAL LIGATION    ? UPPER GASTROINTESTINAL ENDOSCOPY    ? ? ?SOCIAL HISTORY: ?Social History  ? ?Socioeconomic History  ? Marital status: Divorced  ?  Spouse name: Not on file  ? Number of children: 2  ? Years of education: Not on file  ? Highest education level: Not on file  ?Occupational History  ? Occupation: Therapist, sports  ?  Comment: [employer].  ?Tobacco Use  ? Smoking status: Former  ?  Packs/day: 1.00  ?  Types: Cigarettes  ?  Quit date: 11/17/1991  ?  Years since quitting: 30.2  ? Smokeless tobacco: Never  ?Vaping Use  ? Vaping Use: Never used  ?Substance and Sexual Activity  ? Alcohol use: Yes  ?  Comment: daily  ? Drug use: No  ? Sexual activity: Not on file  ?Other Topics Concern  ?  Not on file  ?Social History Narrative  ? Not on file  ? ?Social Determinants of Health  ? ?Financial Resource Strain: Not on file  ?Food Insecurity: Not on file  ?Transportation Needs: Not on file  ?Physical Activity: Not on file  ?Stress: Not on file  ?Social Connections: Not on file  ?Intimate Partner Violence: Not on file  ? ? ?FAMILY HISTORY: ?Family History  ?Problem Relation Age of Onset  ? Heart attack Mother   ? Prostate cancer Father   ? Colon polyps Father 57  ? Dementia Father   ? Esophageal cancer Paternal Grandfather   ? Lung cancer Paternal Grandfather   ? Colon cancer Neg Hx   ? Rectal cancer Neg Hx   ?  Stomach cancer Neg Hx   ? Pancreatic cancer Neg Hx   ? Liver disease Neg Hx   ? ? ?ALLERGIES:  has No Known Allergies. ? ?MEDICATIONS:  ?Current Outpatient Medications  ?Medication Sig Dispense Refill  ? ALPRAZolam (XANAX) 0.5 MG tablet Take 0.5 mg by mouth at bedtime as needed for sleep. Take 1/2 -1 tab at bedtime as needed    ? aspirin EC 81 MG tablet Take 81 mg by mouth daily.    ? buPROPion (WELLBUTRIN SR) 150 MG 12 hr tablet TAKE 1 TABLET BY MOUTH EVERY DAY IN THE MORNING FOR 30 DAYS    ? cyclobenzaprine (FLEXERIL) 10 MG tablet Take 10 mg by mouth at bedtime.    ? docusate sodium (COLACE) 100 MG capsule Take 100 mg by mouth 2 (two) times daily.    ? estradiol (VIVELLE-DOT) 0.1 MG/24HR patch Place 1 patch onto the skin 2 (two) times a week.    ? Ferrous Sulfate (IRON PO) Take 30 mg by mouth at bedtime. Pt stated samples given of 30 mg tablet    ? gabapentin (NEURONTIN) 300 MG capsule Take 600 mg by mouth at bedtime. Take two capsules by mouth at bedtime as needed    ? HYDROcodone-acetaminophen (NORCO) 7.5-325 MG per tablet Take 2 tablets by mouth 3 (three) times daily as needed for pain. Patient only taking 2 tablets three times daily    ? lisinopril (PRINIVIL,ZESTRIL) 10 MG tablet Take 10 mg by mouth daily.    ? meloxicam (MOBIC) 15 MG tablet Take 15 mg by mouth daily.    ? Multiple Vitamin (MULTIVITAMIN) tablet Take 1 tablet by mouth daily.    ? Omega-3 Fatty Acids (FISH OIL) 1000 MG CAPS Take 2 capsules by mouth daily.    ? pantoprazole (PROTONIX) 40 MG tablet TAKE 1 TABLET BY MOUTH EVERY DAY 90 tablet 1  ? pantoprazole (PROTONIX) 40 MG tablet 40 mg 2 (two) times daily.    ? saccharomyces boulardii (FLORASTOR) 250 MG capsule Take 500 mg by mouth daily.     ? sertraline (ZOLOFT) 50 MG tablet Take 25 mg by mouth daily.    ? sucralfate (CARAFATE) 1 g tablet Take 1 g by mouth 2 (two) times daily.    ? tretinoin (RETIN-A) 0.025 % cream tretinoin 0.025 % topical cream ? APPLY TO AFFECTED AREA EVERY DAY AT NIGHT     ? VITAMIN D PO Take 2,000 Units by mouth daily.     ? zolpidem (AMBIEN) 10 MG tablet Take 5 mg by mouth at bedtime as needed for sleep.     ? ?No current facility-administered medications for this visit.  ? ? ?REVIEW OF SYSTEMS:   ?Constitutional: ( - ) fevers, ( - )  chills , ( - )  night sweats ?Eyes: ( - ) blurriness of vision, ( - ) double vision, ( - ) watery eyes ?Ears, nose, mouth, throat, and face: ( - ) mucositis, ( - ) sore throat ?Respiratory: ( - ) cough, ( - ) dyspnea, ( - ) wheezes ?Cardiovascular: ( - ) palpitation, ( - ) chest discomfort, ( - ) lower extremity swelling ?Gastrointestinal:  ( - ) nausea, ( - ) heartburn, ( +) change in bowel habits ?Skin: ( - ) abnormal skin rashes ?Lymphatics: ( - ) new lymphadenopathy, ( - ) easy bruising ?Neurological: ( - ) numbness, ( - ) tingling, ( - ) new weaknesses ?Behavioral/Psych: ( - ) mood change, ( - ) new changes  ?All other systems were reviewed with the patient and are negative. ? ?PHYSICAL EXAMINATION: ?ECOG PERFORMANCE STATUS: 1 - Symptomatic but completely ambulatory ? ?There were no vitals filed for this visit. ?There were no vitals filed for this visit. ? ?GENERAL: well appearing female in NAD  ?SKIN: skin color, texture, turgor are normal, no rashes or significant lesions ?EYES: conjunctiva are pink and non-injected, sclera clear ?OROPHARYNX: no exudate, no erythema; lips, buccal mucosa, and tongue normal  ?NECK: supple, non-tender ?LYMPH:  no palpable lymphadenopathy in the cervical or supraclavicular lymph nodes.  ?LUNGS: clear to auscultation and percussion with normal breathing effort ?HEART: regular rate & rhythm and no murmurs and no lower extremity edema ?ABDOMEN: soft, non-tender, non-distended, normal bowel sounds ?Musculoskeletal: no cyanosis of digits and no clubbing  ?PSYCH: alert & oriented x 3, fluent speech ?NEURO: no focal motor/sensory deficits ? ?LABORATORY DATA:  ?I have reviewed the data as listed ? ?  Latest Ref Rng & Units  02/11/2020  ? 12:20 PM  ?CBC  ?WBC 4.0 - 10.5 K/uL 15.0    ?Hemoglobin 12.0 - 15.0 g/dL 12.5    ?Hematocrit 36.0 - 46.0 % 38.9    ?Platelets 150 - 400 K/uL 451    ? ? ? ?  Latest Ref Rng & Units 02/11/2020

## 2022-02-11 ENCOUNTER — Inpatient Hospital Stay: Payer: Medicare Other

## 2022-02-11 ENCOUNTER — Other Ambulatory Visit: Payer: Self-pay

## 2022-02-11 ENCOUNTER — Inpatient Hospital Stay: Payer: Medicare Other | Attending: Physician Assistant | Admitting: Physician Assistant

## 2022-02-11 ENCOUNTER — Encounter: Payer: Self-pay | Admitting: Physician Assistant

## 2022-02-11 VITALS — BP 118/75 | HR 85 | Temp 97.7°F | Resp 20 | Wt 165.0 lb

## 2022-02-11 DIAGNOSIS — Z808 Family history of malignant neoplasm of other organs or systems: Secondary | ICD-10-CM | POA: Diagnosis not present

## 2022-02-11 DIAGNOSIS — Q2739 Arteriovenous malformation, other site: Secondary | ICD-10-CM

## 2022-02-11 DIAGNOSIS — Z79899 Other long term (current) drug therapy: Secondary | ICD-10-CM | POA: Diagnosis not present

## 2022-02-11 DIAGNOSIS — Z8042 Family history of malignant neoplasm of prostate: Secondary | ICD-10-CM | POA: Diagnosis not present

## 2022-02-11 DIAGNOSIS — Z8371 Family history of colonic polyps: Secondary | ICD-10-CM

## 2022-02-11 DIAGNOSIS — Z801 Family history of malignant neoplasm of trachea, bronchus and lung: Secondary | ICD-10-CM | POA: Diagnosis not present

## 2022-02-11 DIAGNOSIS — D509 Iron deficiency anemia, unspecified: Secondary | ICD-10-CM

## 2022-02-11 DIAGNOSIS — Z85820 Personal history of malignant melanoma of skin: Secondary | ICD-10-CM | POA: Insufficient documentation

## 2022-02-11 DIAGNOSIS — F32A Depression, unspecified: Secondary | ICD-10-CM | POA: Diagnosis not present

## 2022-02-11 DIAGNOSIS — Z87891 Personal history of nicotine dependence: Secondary | ICD-10-CM

## 2022-02-11 LAB — CBC WITH DIFFERENTIAL (CANCER CENTER ONLY)
Abs Immature Granulocytes: 0.02 10*3/uL (ref 0.00–0.07)
Basophils Absolute: 0.1 10*3/uL (ref 0.0–0.1)
Basophils Relative: 1 %
Eosinophils Absolute: 0.1 10*3/uL (ref 0.0–0.5)
Eosinophils Relative: 1 %
HCT: 37.3 % (ref 36.0–46.0)
Hemoglobin: 11.9 g/dL — ABNORMAL LOW (ref 12.0–15.0)
Immature Granulocytes: 0 %
Lymphocytes Relative: 25 %
Lymphs Abs: 2.2 10*3/uL (ref 0.7–4.0)
MCH: 27.6 pg (ref 26.0–34.0)
MCHC: 31.9 g/dL (ref 30.0–36.0)
MCV: 86.5 fL (ref 80.0–100.0)
Monocytes Absolute: 0.7 10*3/uL (ref 0.1–1.0)
Monocytes Relative: 7 %
Neutro Abs: 5.9 10*3/uL (ref 1.7–7.7)
Neutrophils Relative %: 66 %
Platelet Count: 507 10*3/uL — ABNORMAL HIGH (ref 150–400)
RBC: 4.31 MIL/uL (ref 3.87–5.11)
RDW: 17.3 % — ABNORMAL HIGH (ref 11.5–15.5)
WBC Count: 8.9 10*3/uL (ref 4.0–10.5)
nRBC: 0 % (ref 0.0–0.2)

## 2022-02-11 LAB — CMP (CANCER CENTER ONLY)
ALT: 11 U/L (ref 0–44)
AST: 16 U/L (ref 15–41)
Albumin: 3.9 g/dL (ref 3.5–5.0)
Alkaline Phosphatase: 40 U/L (ref 38–126)
Anion gap: 4 — ABNORMAL LOW (ref 5–15)
BUN: 16 mg/dL (ref 8–23)
CO2: 28 mmol/L (ref 22–32)
Calcium: 8.9 mg/dL (ref 8.9–10.3)
Chloride: 102 mmol/L (ref 98–111)
Creatinine: 0.76 mg/dL (ref 0.44–1.00)
GFR, Estimated: >60 mL/min (ref >60)
Glucose, Bld: 85 mg/dL (ref 70–99)
Potassium: 4.5 mmol/L (ref 3.5–5.1)
Sodium: 134 mmol/L — ABNORMAL LOW (ref 135–145)
Total Bilirubin: 0.3 mg/dL (ref 0.3–1.2)
Total Protein: 6.5 g/dL (ref 6.5–8.1)

## 2022-02-11 LAB — RETIC PANEL
Immature Retic Fract: 6.5 % (ref 2.3–15.9)
RBC.: 4.26 MIL/uL (ref 3.87–5.11)
Retic Count, Absolute: 37.1 10*3/uL (ref 19.0–186.0)
Retic Ct Pct: 0.9 % (ref 0.4–3.1)
Reticulocyte Hemoglobin: 31.9 pg (ref >27.9)

## 2022-02-11 LAB — IRON AND IRON BINDING CAPACITY (CC-WL,HP ONLY)
Iron: 61 ug/dL (ref 28–170)
Saturation Ratios: 17 % (ref 10.4–31.8)
TIBC: 351 ug/dL (ref 250–450)
UIBC: 290 ug/dL (ref 148–442)

## 2022-02-13 MED ORDER — FERROUS SULFATE 325 (65 FE) MG PO TBEC: 325.0000 mg | DELAYED_RELEASE_TABLET | Freq: Every day | ORAL | 3 refills | Status: DC

## 2022-02-14 ENCOUNTER — Telehealth: Payer: Self-pay | Admitting: Physician Assistant

## 2022-02-14 ENCOUNTER — Other Ambulatory Visit: Payer: Self-pay | Admitting: Physician Assistant

## 2022-02-14 DIAGNOSIS — D509 Iron deficiency anemia, unspecified: Secondary | ICD-10-CM

## 2022-02-14 NOTE — Telephone Encounter (Signed)
Scheudled per 4/7 los, pt has been called and confirmed  ?

## 2022-02-16 ENCOUNTER — Telehealth: Payer: Self-pay

## 2022-02-16 NOTE — Telephone Encounter (Signed)
-----   Message from Lincoln Brigham, PA-C sent at 02/16/2022  3:21 PM EDT ----- ?Please notify patient that lab results show mild anemia with Hgb 11.9. Okay to continue on iron pills for now. No need for IV iron at this time. We will see her back in 3 months to repeat labs.  ?

## 2022-02-16 NOTE — Telephone Encounter (Signed)
LM for pt with lab results and recommendations.  HB ?

## 2022-02-23 ENCOUNTER — Telehealth: Payer: Self-pay | Admitting: Nurse Practitioner

## 2022-02-23 ENCOUNTER — Other Ambulatory Visit: Payer: Self-pay

## 2022-02-23 MED ORDER — PANTOPRAZOLE SODIUM 40 MG PO TBEC
40.0000 mg | DELAYED_RELEASE_TABLET | Freq: Two times a day (BID) | ORAL | 11 refills | Status: DC
Start: 2022-02-23 — End: 2022-10-12

## 2022-02-23 NOTE — Telephone Encounter (Signed)
Pt is requesting protonix 40 mg BID. Pt stated at last appt she was advised to take protonix 2 times a day but she only had a prescription for once a day so she has run out early. Is it ok to send the prescription to pt's pharmacy?  ?

## 2022-02-23 NOTE — Telephone Encounter (Signed)
Yes, please adjust prescription to reflect pantoprazole 40 mg twice daily.  Multiple refills.  Thanks ?

## 2022-02-23 NOTE — Telephone Encounter (Signed)
Prescription sent to pt's pharmacy. Left message on pt's voicemail to notify her that prescription has been sent.  ?

## 2022-03-08 ENCOUNTER — Other Ambulatory Visit: Payer: Self-pay | Admitting: Physician Assistant

## 2022-04-26 ENCOUNTER — Telehealth: Payer: Self-pay | Admitting: Physician Assistant

## 2022-04-26 NOTE — Telephone Encounter (Signed)
R/s per provider pal, message has been left with pt

## 2022-05-10 ENCOUNTER — Encounter: Payer: Self-pay | Admitting: Internal Medicine

## 2022-05-13 ENCOUNTER — Other Ambulatory Visit: Payer: Medicare Other

## 2022-05-13 ENCOUNTER — Ambulatory Visit: Payer: Medicare Other | Admitting: Physician Assistant

## 2022-05-18 ENCOUNTER — Other Ambulatory Visit: Payer: Medicare Other

## 2022-05-18 ENCOUNTER — Ambulatory Visit: Payer: Medicare Other | Admitting: Physician Assistant

## 2022-05-20 ENCOUNTER — Inpatient Hospital Stay (HOSPITAL_BASED_OUTPATIENT_CLINIC_OR_DEPARTMENT_OTHER): Payer: Medicare Other | Admitting: Physician Assistant

## 2022-05-20 ENCOUNTER — Inpatient Hospital Stay: Payer: Medicare Other | Attending: Physician Assistant

## 2022-05-20 ENCOUNTER — Other Ambulatory Visit: Payer: Self-pay | Admitting: Physician Assistant

## 2022-05-20 VITALS — BP 129/83 | HR 96 | Temp 97.7°F | Resp 20 | Wt 167.0 lb

## 2022-05-20 DIAGNOSIS — D509 Iron deficiency anemia, unspecified: Secondary | ICD-10-CM

## 2022-05-20 DIAGNOSIS — Z87891 Personal history of nicotine dependence: Secondary | ICD-10-CM | POA: Insufficient documentation

## 2022-05-20 DIAGNOSIS — Q2739 Arteriovenous malformation, other site: Secondary | ICD-10-CM | POA: Insufficient documentation

## 2022-05-20 DIAGNOSIS — D75839 Thrombocytosis, unspecified: Secondary | ICD-10-CM | POA: Insufficient documentation

## 2022-05-20 DIAGNOSIS — Z79899 Other long term (current) drug therapy: Secondary | ICD-10-CM | POA: Diagnosis not present

## 2022-05-20 LAB — CMP (CANCER CENTER ONLY)
ALT: 11 U/L (ref 0–44)
AST: 16 U/L (ref 15–41)
Albumin: 4.4 g/dL (ref 3.5–5.0)
Alkaline Phosphatase: 47 U/L (ref 38–126)
Anion gap: 6 (ref 5–15)
BUN: 15 mg/dL (ref 8–23)
CO2: 27 mmol/L (ref 22–32)
Calcium: 9.4 mg/dL (ref 8.9–10.3)
Chloride: 100 mmol/L (ref 98–111)
Creatinine: 0.89 mg/dL (ref 0.44–1.00)
GFR, Estimated: >60 mL/min (ref >60)
Glucose, Bld: 88 mg/dL (ref 70–99)
Potassium: 4 mmol/L (ref 3.5–5.1)
Sodium: 133 mmol/L — ABNORMAL LOW (ref 135–145)
Total Bilirubin: 0.4 mg/dL (ref 0.3–1.2)
Total Protein: 7 g/dL (ref 6.5–8.1)

## 2022-05-20 LAB — CBC WITH DIFFERENTIAL (CANCER CENTER ONLY)
Abs Immature Granulocytes: 0.02 10*3/uL (ref 0.00–0.07)
Basophils Absolute: 0.1 10*3/uL (ref 0.0–0.1)
Basophils Relative: 1 %
Eosinophils Absolute: 0.1 10*3/uL (ref 0.0–0.5)
Eosinophils Relative: 1 %
HCT: 39.1 % (ref 36.0–46.0)
Hemoglobin: 13.3 g/dL (ref 12.0–15.0)
Immature Granulocytes: 0 %
Lymphocytes Relative: 20 %
Lymphs Abs: 2.2 10*3/uL (ref 0.7–4.0)
MCH: 31.1 pg (ref 26.0–34.0)
MCHC: 34 g/dL (ref 30.0–36.0)
MCV: 91.6 fL (ref 80.0–100.0)
Monocytes Absolute: 0.8 10*3/uL (ref 0.1–1.0)
Monocytes Relative: 8 %
Neutro Abs: 7.8 10*3/uL — ABNORMAL HIGH (ref 1.7–7.7)
Neutrophils Relative %: 70 %
Platelet Count: 497 10*3/uL — ABNORMAL HIGH (ref 150–400)
RBC: 4.27 MIL/uL (ref 3.87–5.11)
RDW: 14.7 % (ref 11.5–15.5)
WBC Count: 11 10*3/uL — ABNORMAL HIGH (ref 4.0–10.5)
nRBC: 0 % (ref 0.0–0.2)

## 2022-05-20 LAB — IRON AND IRON BINDING CAPACITY (CC-WL,HP ONLY)
Iron: 43 ug/dL (ref 28–170)
Saturation Ratios: 11 % (ref 10.4–31.8)
TIBC: 405 ug/dL (ref 250–450)
UIBC: 362 ug/dL (ref 148–442)

## 2022-05-20 LAB — RETIC PANEL
Immature Retic Fract: 13.9 % (ref 2.3–15.9)
RBC.: 4.34 MIL/uL (ref 3.87–5.11)
Retic Count, Absolute: 49.9 10*3/uL (ref 19.0–186.0)
Retic Ct Pct: 1.2 % (ref 0.4–3.1)
Reticulocyte Hemoglobin: 35.2 pg (ref >27.9)

## 2022-05-20 LAB — FERRITIN: Ferritin: 13 ng/mL (ref 11–307)

## 2022-05-22 ENCOUNTER — Encounter: Payer: Self-pay | Admitting: Physician Assistant

## 2022-05-22 NOTE — Progress Notes (Signed)
[place] [phone]   Fax:(336) [phone]  PROGRESS NOTE  Patient Care Team: Donnajean Lopes, MD as PCP - General (Internal Medicine)  CHIEF COMPLAINTS/PURPOSE OF CONSULTATION:  Iron deficiency anemia  HISTORY OF PRESENTING ILLNESS:  Erica Potts 66 y.o. female returns for a follow up for iron deficiency anemia. She is unaccompanied for this visit.   On exam today, Erica Potts low energy levels that interfere with her ADLs. She is taking her iron pills every other day to minimize constipation. She denies nausea, vomiting or abdominal pain. She denies any easy bruising or signs of active bleeding. She denies fevers, chills, night sweats, shortness of breath, chest pain or cough. She has no other complaints. Rest of the 10 point ROS is below.   MEDICAL HISTORY:  Past Medical History:  Diagnosis Date   Anxiety    Clostridium difficile infection    Colitis    DDD (degenerative disc disease), lumbar    Depression    HLD (hyperlipidemia)    HTN (hypertension)    IDA (iron deficiency anemia)    Insomnia    Melanoma ([place]) 2000's   Ovarian cyst, left    Palpitations    Spinal stenosis     SURGICAL HISTORY: Past Surgical History:  Procedure Laterality Date   BREAST SURGERY     benign nodule   CESAREAN SECTION  1983, 1986   x 2   COLONOSCOPY     DILATION AND CURETTAGE OF UTERUS  2012   ESOPHAGOGASTRODUODENOSCOPY (EGD) WITH PROPOFOL N/A 08/17/2021   Procedure: ESOPHAGOGASTRODUODENOSCOPY (EGD) WITH PROPOFOL;  Surgeon: Amery Minasyan Shipper, MD;  Location: [facility];  Service: Endoscopy;  Laterality: N/A;   LAPAROSCOPIC TOTAL HYSTERECTOMY  2013   heavy bleeding   MELANOMA EXCISION     MENISCUS REPAIR Left 2011   MOUTH SURGERY Right    maxilary   nipple biopsy     OVARIAN CYST REMOVAL  1990   POLYPECTOMY     TUBAL LIGATION     UPPER GASTROINTESTINAL ENDOSCOPY      SOCIAL HISTORY: Social History   Socioeconomic History    Marital status: Divorced    Spouse name: Not on file   Number of children: 2   Years of education: Not on file   Highest education level: Not on file  Occupational History   Occupation: RN    Comment: [employer].  Tobacco Use   Smoking status: Former    Packs/day: 1.00    Types: Cigarettes    Quit date: 11/17/1991    Years since quitting: 30.5   Smokeless tobacco: Never  Vaping Use   Vaping Use: Never used  Substance and Sexual Activity   Alcohol use: Yes    Alcohol/week: 7.0 standard drinks of alcohol    Types: 7 Standard drinks or equivalent per week    Comment: daily   Drug use: No   Sexual activity: Not on file  Other Topics Concern   Not on file  Social History Narrative   Not on file   Social Determinants of Health   Financial Resource Strain: Not on file  Food Insecurity: Not on file  Transportation Needs: Not on file  Physical Activity: Not on file  Stress: Not on file  Social Connections: Not on file  Intimate Partner Violence: Not on file    FAMILY HISTORY: Family History  Problem Relation Age of Onset   Heart attack Mother        possible  blood disorder/cancer   Prostate cancer Father    Colon polyps Father 42   Dementia Father    Esophageal cancer Paternal Grandfather    Lung cancer Paternal Grandfather    Colon cancer Neg Hx    Rectal cancer Neg Hx    Stomach cancer Neg Hx    Pancreatic cancer Neg Hx    Liver disease Neg Hx     ALLERGIES:  has No Known Allergies.  MEDICATIONS:  Current Outpatient Medications  Medication Sig Dispense Refill   ALPRAZolam (XANAX) 0.5 MG tablet Take 0.5 mg by mouth at bedtime as needed for sleep. Take 1/2 -1 tab at bedtime as needed     aspirin EC 81 MG tablet Take 81 mg by mouth daily.     cyclobenzaprine (FLEXERIL) 10 MG tablet Take 10 mg by mouth at bedtime.     docusate sodium (COLACE) 100 MG capsule Take 100 mg by mouth 2 (two) times daily.     DULoxetine (CYMBALTA) 30 MG capsule Take 60 mg by  mouth daily.     estradiol (VIVELLE-DOT) 0.1 MG/24HR patch Place 1 patch onto the skin 2 (two) times a week.     ferrous sulfate 325 (65 FE) MG EC tablet TAKE 1 TABLET BY MOUTH EVERY DAY WITH BREAKFAST 90 tablet 1   gabapentin (NEURONTIN) 300 MG capsule Take 600 mg by mouth at bedtime. Take two capsules by mouth at bedtime as needed     HYDROcodone-acetaminophen (NORCO) 7.5-325 MG per tablet Take 2 tablets by mouth 3 (three) times daily as needed for pain. Patient only taking 2 tablets three times daily     lisinopril (PRINIVIL,ZESTRIL) 10 MG tablet Take 10 mg by mouth daily.     meloxicam (MOBIC) 15 MG tablet Take 15 mg by mouth daily.     Multiple Vitamin (MULTIVITAMIN) tablet Take 1 tablet by mouth daily.     Omega-3 Fatty Acids (FISH OIL) 1000 MG CAPS Take 2 capsules by mouth daily.     pantoprazole (PROTONIX) 40 MG tablet Take 1 tablet (40 mg total) by mouth 2 (two) times daily. 60 tablet 11   saccharomyces boulardii (FLORASTOR) 250 MG capsule Take 500 mg by mouth daily.      sucralfate (CARAFATE) 1 g tablet Take 1 g by mouth 2 (two) times daily.     tretinoin (RETIN-A) 0.025 % cream tretinoin 0.025 % topical cream  APPLY TO AFFECTED AREA EVERY DAY AT NIGHT     VITAMIN D PO Take 2,000 Units by mouth daily.      zolpidem (AMBIEN) 10 MG tablet Take 5 mg by mouth at bedtime as needed for sleep.      No current facility-administered medications for this visit.    REVIEW OF SYSTEMS:   Constitutional: ( - ) fevers, ( - )  chills , ( - ) night sweats Eyes: ( - ) blurriness of vision, ( - ) double vision, ( - ) watery eyes Ears, nose, mouth, throat, and face: ( - ) mucositis, ( - ) sore throat Respiratory: ( - ) cough, ( - ) dyspnea, ( - ) wheezes Cardiovascular: ( - ) palpitation, ( - ) chest discomfort, ( - ) lower extremity swelling Gastrointestinal:  ( - ) nausea, ( - ) heartburn, ( -) change in bowel habits Skin: ( - ) abnormal skin rashes Lymphatics: ( - ) new lymphadenopathy, ( - )  easy bruising Neurological: ( - ) numbness, ( - ) tingling, ( - ) new weaknesses  Behavioral/Psych: ( - ) mood change, ( - ) new changes  All other systems were reviewed with the patient and are negative.  PHYSICAL EXAMINATION: ECOG PERFORMANCE STATUS: 1 - Symptomatic but completely ambulatory  Vitals:   05/20/22 1255  BP: 129/83  Pulse: 96  Resp: 20  Temp: 97.7 F (36.5 C)  SpO2: 97%   Filed Weights   05/20/22 1255  Weight: 167 lb (75.8 kg)    GENERAL: well appearing female in NAD  SKIN: skin color, texture, turgor are normal, no rashes or significant lesions EYES: conjunctiva are pink and non-injected, sclera clear LUNGS: clear to auscultation and percussion with normal breathing effort HEART: regular rate & rhythm and no murmurs and no lower extremity edema Musculoskeletal: no cyanosis of digits and no clubbing  PSYCH: alert & oriented x 3, fluent speech NEURO: no focal motor/sensory deficits  LABORATORY DATA:  I have reviewed the data as listed    Latest Ref Rng & Units 05/20/2022   11:38 AM 02/11/2022    3:02 PM 02/11/2020   12:20 PM  CBC  WBC 4.0 - 10.5 K/uL 11.0  8.9  15.0   Hemoglobin 12.0 - 15.0 g/dL 13.3  11.9  12.5   Hematocrit 36.0 - 46.0 % 39.1  37.3  38.9   Platelets 150 - 400 K/uL 497  507  451        Latest Ref Rng & Units 05/20/2022   11:38 AM 02/11/2022    3:02 PM 02/11/2020   12:20 PM  CMP  Glucose 70 - 99 mg/dL 88  85  89   BUN 8 - 23 mg/dL '15  16  7   '$ Creatinine 0.44 - 1.00 mg/dL 0.89  0.76  0.72   Sodium 135 - 145 mmol/L 133  134  136   Potassium 3.5 - 5.1 mmol/L 4.0  4.5  3.4   Chloride 98 - 111 mmol/L 100  102  104   CO2 22 - 32 mmol/L '27  28  25   '$ Calcium 8.9 - 10.3 mg/dL 9.4  8.9  8.7   Total Protein 6.5 - 8.1 g/dL 7.0  6.5  6.9   Total Bilirubin 0.3 - 1.2 mg/dL 0.4  0.3  0.9   Alkaline Phos 38 - 126 U/L 47  40  43   AST 15 - 41 U/L '16  16  23   '$ ALT 0 - 44 U/L '11  11  19      '$ ASSESSMENT & PLAN Erica Potts is a 66 y.o. female  returns to the clinic for follow up of iron deficiency anemia.   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 Iron panel shows persistent deficiency with iron saturation 11%, ferritin 13 --Currently on ferrous sulfate 325 mg every other day with a source of vitamin C. Due to constipation, okay to discontinue --Proceed with IV iron infusions to help bolster iron levels.  --RTC in 3 months with repeat labs  #Thrombocytosis: --Secondary to iron deficiency --Platelet count has improved to 497K --continue to monitor.   No orders of the defined types were placed in this encounter.   All questions were answered. The patient knows to call the clinic with any problems, questions or concerns.  I have spent a total of 30 minutes minutes of face-to-face and non-face-to-face time, preparing to see the patient, performing a medically appropriate examination, counseling and educating the patient, ordering medications/tests, documenting clinical information in the  electronic health record, and care coordination.   Dede Query, PA-C Department of Hematology/Oncology [place] at [facility] Phone: [phone]

## 2022-05-23 ENCOUNTER — Telehealth: Payer: Self-pay | Admitting: Physician Assistant

## 2022-05-23 ENCOUNTER — Telehealth: Payer: Self-pay | Admitting: Pharmacy Technician

## 2022-05-23 ENCOUNTER — Telehealth: Payer: Self-pay

## 2022-05-23 NOTE — Telephone Encounter (Signed)
Dr. Charlies Silvers, Juluis Rainier note:  Auth Submission:no auth needed Payer: medicare a/b - bcbs supp Medication & CPT/J Code(s) submitted: monoferric J1437 Route of submission (phone, fax, portal): phone Auth type: Buy/Bill Units/visits requested: x1 dose Reference number: [id number] Approval from: 05/23/22 to 11/06/22   Patient will be scheduled as soon as possible

## 2022-05-23 NOTE — Telephone Encounter (Signed)
Attempted to contact pt regarding lab results.  No answer and VM was full.  Sent pt a MY Chart message.

## 2022-05-23 NOTE — Telephone Encounter (Signed)
Scheduled appointment per 07/14 los. Left message.

## 2022-05-23 NOTE — Telephone Encounter (Signed)
-----   Message from Lincoln Brigham, PA-C sent at 05/23/2022 11:19 AM EDT ----- Please notify patient that there is still iron deficiency so we will arrange for IV iron infusion.

## 2022-05-25 DIAGNOSIS — Z1231 Encounter for screening mammogram for malignant neoplasm of breast: Secondary | ICD-10-CM

## 2022-05-27 ENCOUNTER — Ambulatory Visit (INDEPENDENT_AMBULATORY_CARE_PROVIDER_SITE_OTHER): Payer: Medicare Other

## 2022-05-27 VITALS — BP 104/68 | HR 83 | Temp 98.5°F | Resp 16 | Ht 66.0 in | Wt 170.6 lb

## 2022-05-27 DIAGNOSIS — D508 Other iron deficiency anemias: Secondary | ICD-10-CM

## 2022-05-27 MED ORDER — SODIUM CHLORIDE 0.9 % IV SOLN
1000.0000 mg | Freq: Once | INTRAVENOUS | Status: AC
  Administered 2022-05-27: 1000 mg via INTRAVENOUS
  Filled 2022-05-27: qty 10

## 2022-05-27 NOTE — Progress Notes (Signed)
Diagnosis: Iron Deficiency Anemia  Provider:  Marshell Garfinkel, MD  Procedure: Infusion  IV Type: Peripheral, IV Location: L Antecubital  Monoferric (Ferric Derisomaltose), Dose: 1000 mg  Infusion Start Time: 1444  Infusion Stop Time: 1510  Post Infusion IV Care: Observation period completed  Discharge: Condition: Good, Destination: Home . AVS provided to patient.   Performed by:  Paul Dykes, RN

## 2022-06-19 ENCOUNTER — Encounter: Payer: Self-pay | Admitting: Internal Medicine

## 2022-06-20 ENCOUNTER — Other Ambulatory Visit: Payer: Medicare Other

## 2022-06-24 ENCOUNTER — Ambulatory Visit: Payer: Medicare Other

## 2022-07-04 ENCOUNTER — Ambulatory Visit
Admission: RE | Admit: 2022-07-04 | Discharge: 2022-07-04 | Disposition: A | Discharge status: Home / Self Care | Payer: BLUE CROSS/BLUE SHIELD | Source: Ambulatory Visit | Attending: Internal Medicine | Admitting: Internal Medicine

## 2022-07-04 ENCOUNTER — Other Ambulatory Visit: Payer: Self-pay | Admitting: Internal Medicine

## 2022-07-04 DIAGNOSIS — R928 Other abnormal and inconclusive findings on diagnostic imaging of breast: Secondary | ICD-10-CM

## 2022-08-19 ENCOUNTER — Other Ambulatory Visit: Payer: BLUE CROSS/BLUE SHIELD

## 2022-08-19 ENCOUNTER — Ambulatory Visit: Payer: BLUE CROSS/BLUE SHIELD | Admitting: Physician Assistant

## 2022-09-08 ENCOUNTER — Other Ambulatory Visit: Payer: Self-pay | Admitting: Physician Assistant

## 2022-09-08 DIAGNOSIS — D509 Iron deficiency anemia, unspecified: Secondary | ICD-10-CM

## 2022-09-09 ENCOUNTER — Telehealth: Payer: Self-pay

## 2022-09-09 ENCOUNTER — Inpatient Hospital Stay: Payer: Medicare Other | Attending: Physician Assistant

## 2022-09-09 ENCOUNTER — Inpatient Hospital Stay (HOSPITAL_BASED_OUTPATIENT_CLINIC_OR_DEPARTMENT_OTHER): Payer: Medicare Other | Admitting: Physician Assistant

## 2022-09-09 VITALS — BP 134/82 | HR 99 | Temp 97.6°F | Resp 15 | Wt 161.0 lb

## 2022-09-09 DIAGNOSIS — D509 Iron deficiency anemia, unspecified: Secondary | ICD-10-CM

## 2022-09-09 DIAGNOSIS — Q2739 Arteriovenous malformation, other site: Secondary | ICD-10-CM | POA: Insufficient documentation

## 2022-09-09 DIAGNOSIS — D75839 Thrombocytosis, unspecified: Secondary | ICD-10-CM | POA: Diagnosis not present

## 2022-09-09 DIAGNOSIS — D500 Iron deficiency anemia secondary to blood loss (chronic): Secondary | ICD-10-CM | POA: Diagnosis not present

## 2022-09-09 DIAGNOSIS — Z79899 Other long term (current) drug therapy: Secondary | ICD-10-CM | POA: Diagnosis not present

## 2022-09-09 LAB — CMP (CANCER CENTER ONLY)
ALT: 15 U/L (ref 0–44)
AST: 19 U/L (ref 15–41)
Albumin: 4.2 g/dL (ref 3.5–5.0)
Alkaline Phosphatase: 60 U/L (ref 38–126)
Anion gap: 5 (ref 5–15)
BUN: 13 mg/dL (ref 8–23)
CO2: 29 mmol/L (ref 22–32)
Calcium: 9.1 mg/dL (ref 8.9–10.3)
Chloride: 101 mmol/L (ref 98–111)
Creatinine: 0.84 mg/dL (ref 0.44–1.00)
GFR, Estimated: >60 mL/min (ref >60)
Glucose, Bld: 70 mg/dL (ref 70–99)
Potassium: 3.8 mmol/L (ref 3.5–5.1)
Sodium: 135 mmol/L (ref 135–145)
Total Bilirubin: 0.3 mg/dL (ref 0.3–1.2)
Total Protein: 7 g/dL (ref 6.5–8.1)

## 2022-09-09 LAB — CBC WITH DIFFERENTIAL (CANCER CENTER ONLY)
Abs Immature Granulocytes: 0.03 10*3/uL (ref 0.00–0.07)
Basophils Absolute: 0.1 10*3/uL (ref 0.0–0.1)
Basophils Relative: 1 %
Eosinophils Absolute: 0.2 10*3/uL (ref 0.0–0.5)
Eosinophils Relative: 2 %
HCT: 37.6 % (ref 36.0–46.0)
Hemoglobin: 12.1 g/dL (ref 12.0–15.0)
Immature Granulocytes: 0 %
Lymphocytes Relative: 28 %
Lymphs Abs: 2.3 10*3/uL (ref 0.7–4.0)
MCH: 30.6 pg (ref 26.0–34.0)
MCHC: 32.2 g/dL (ref 30.0–36.0)
MCV: 94.9 fL (ref 80.0–100.0)
Monocytes Absolute: 0.8 10*3/uL (ref 0.1–1.0)
Monocytes Relative: 9 %
Neutro Abs: 5 10*3/uL (ref 1.7–7.7)
Neutrophils Relative %: 60 %
Platelet Count: 552 10*3/uL — ABNORMAL HIGH (ref 150–400)
RBC: 3.96 MIL/uL (ref 3.87–5.11)
RDW: 12.9 % (ref 11.5–15.5)
WBC Count: 8.4 10*3/uL (ref 4.0–10.5)
nRBC: 0 % (ref 0.0–0.2)

## 2022-09-09 LAB — IRON AND IRON BINDING CAPACITY (CC-WL,HP ONLY)
Iron: 52 ug/dL (ref 28–170)
Saturation Ratios: 17 % (ref 10.4–31.8)
TIBC: 307 ug/dL (ref 250–450)
UIBC: 255 ug/dL (ref 148–442)

## 2022-09-09 LAB — FERRITIN: Ferritin: 41 ng/mL (ref 11–307)

## 2022-09-09 NOTE — Telephone Encounter (Signed)
LM for pt with lab results and instructions to resume oral iron and no IV iron needed.  RTC in 3 mo.

## 2022-09-09 NOTE — Telephone Encounter (Signed)
-----   Message from Lincoln Brigham, PA-C sent at 09/09/2022  3:30 PM EDT ----- Please notify patient that iron levels have improved. No need for IV iron at this time. Resume iron pills daily and we will see patient back in 3 months.

## 2022-09-09 NOTE — Progress Notes (Signed)
[place] [phone]   Fax:([phone]  PROGRESS NOTE  Patient Care Team: Donnajean Lopes, MD as PCP - General (Internal Medicine)  CHIEF COMPLAINTS/PURPOSE OF CONSULTATION:  Iron deficiency anemia  HISTORY OF PRESENTING ILLNESS:  Erica Potts 66 y.o. female returns for a follow up for iron deficiency anemia. She is unaccompanied for this visit. She was last seen 05/20/2022.  In the interim, she received IV Monoferric x1 dose.  Erica Potts reports her energy levels are fairly stable.  She does have intermittent episodes of fatigue that she contributes to tapering off her Ambien is affecting her sleep.  Additionally, she adds that she recently started an antidepressant.  She denies any appetite changes but has lost approximately 9 pounds since July 2023.  She denies nausea, vomiting abdominal pain.  Her bowel habits are unchanged without any current episodes of diarrhea or constipation. She denies any easy bruising or signs of active bleeding. She denies fevers, chills, night sweats, shortness of breath, chest pain or cough. She has no other complaints. Rest of the 10 point ROS is below.   MEDICAL HISTORY:  Past Medical History:  Diagnosis Date   Anxiety    Clostridium difficile infection    Colitis    DDD (degenerative disc disease), lumbar    Depression    HLD (hyperlipidemia)    HTN (hypertension)    IDA (iron deficiency anemia)    Insomnia    Melanoma ([place]) 2000's   Ovarian cyst, left    Palpitations    Spinal stenosis     SURGICAL HISTORY: Past Surgical History:  Procedure Laterality Date   BREAST SURGERY     benign nodule   CESAREAN SECTION  1983, 1986   x 2   COLONOSCOPY     DILATION AND CURETTAGE OF UTERUS  2012   ESOPHAGOGASTRODUODENOSCOPY (EGD) WITH PROPOFOL N/A 08/17/2021   Procedure: ESOPHAGOGASTRODUODENOSCOPY (EGD) WITH PROPOFOL;  Surgeon: Ja Ohman Shipper, MD;  Location: [facility];  Service: Endoscopy;  Laterality:  N/A;   LAPAROSCOPIC TOTAL HYSTERECTOMY  2013   heavy bleeding   MELANOMA EXCISION     MENISCUS REPAIR Left 2011   MOUTH SURGERY Right    maxilary   nipple biopsy     OVARIAN CYST REMOVAL  1990   POLYPECTOMY     TUBAL LIGATION     UPPER GASTROINTESTINAL ENDOSCOPY      SOCIAL HISTORY: Social History   Socioeconomic History   Marital status: Divorced    Spouse name: Not on file   Number of children: 2   Years of education: Not on file   Highest education level: Not on file  Occupational History   Occupation: RN    Comment: [employer].  Tobacco Use   Smoking status: Former    Packs/day: 1.00    Types: Cigarettes    Quit date: 11/17/1991    Years since quitting: 30.8   Smokeless tobacco: Never  Vaping Use   Vaping Use: Never used  Substance and Sexual Activity   Alcohol use: Yes    Alcohol/week: 7.0 standard drinks of alcohol    Types: 7 Standard drinks or equivalent per week    Comment: daily   Drug use: No   Sexual activity: Not on file  Other Topics Concern   Not on file  Social History Narrative   Not on file   Social Determinants of Health   Financial Resource Strain: Not on file  Food Insecurity: Not on file  Transportation Needs: Not on file  Physical Activity: Not on file  Stress: Not on file  Social Connections: Not on file  Intimate Partner Violence: Not on file    FAMILY HISTORY: Family History  Problem Relation Age of Onset   Heart attack Mother        possible blood disorder/cancer   Prostate cancer Father    Colon polyps Father 77   Dementia Father    Esophageal cancer Paternal Grandfather    Lung cancer Paternal Grandfather    Colon cancer Neg Hx    Rectal cancer Neg Hx    Stomach cancer Neg Hx    Pancreatic cancer Neg Hx    Liver disease Neg Hx     ALLERGIES:  has No Known Allergies.  MEDICATIONS:  Current Outpatient Medications  Medication Sig Dispense Refill   ALPRAZolam (XANAX) 0.5 MG tablet Take 0.5 mg by mouth at  bedtime as needed for sleep. Take 1/2 -1 tab at bedtime as needed     aspirin EC 81 MG tablet Take 81 mg by mouth daily.     buPROPion (WELLBUTRIN SR) 150 MG 12 hr tablet Take 150 mg by mouth every morning.     buPROPion (WELLBUTRIN) 75 MG tablet Take 75 mg by mouth every morning.     cyclobenzaprine (FLEXERIL) 10 MG tablet Take 10 mg by mouth at bedtime.     docusate sodium (COLACE) 100 MG capsule Take 100 mg by mouth 2 (two) times daily.     DULoxetine (CYMBALTA) 30 MG capsule Take 30 mg by mouth daily.     estradiol (VIVELLE-DOT) 0.1 MG/24HR patch Place 1 patch onto the skin 2 (two) times a week.     gabapentin (NEURONTIN) 300 MG capsule Take 600 mg by mouth at bedtime. Take two capsules by mouth at bedtime as needed     HYDROcodone-acetaminophen (NORCO) 7.5-325 MG per tablet Take 2 tablets by mouth 3 (three) times daily as needed for pain. Patient only taking 2 tablets three times daily     lisinopril (PRINIVIL,ZESTRIL) 10 MG tablet Take 10 mg by mouth daily.     meloxicam (MOBIC) 15 MG tablet Take 15 mg by mouth daily.     Multiple Vitamin (MULTIVITAMIN) tablet Take 1 tablet by mouth daily.     Omega-3 Fatty Acids (FISH OIL) 1000 MG CAPS Take 2 capsules by mouth daily.     pantoprazole (PROTONIX) 40 MG tablet Take 1 tablet (40 mg total) by mouth 2 (two) times daily. 60 tablet 11   saccharomyces boulardii (FLORASTOR) 250 MG capsule Take 500 mg by mouth daily.      sucralfate (CARAFATE) 1 g tablet Take 1 g by mouth 2 (two) times daily.     tretinoin (RETIN-A) 0.025 % cream tretinoin 0.025 % topical cream  APPLY TO AFFECTED AREA EVERY DAY AT NIGHT     VITAMIN D PO Take 2,000 Units by mouth daily.      zolpidem (AMBIEN) 10 MG tablet Take 10 mg by mouth at bedtime as needed for sleep.     ferrous sulfate 325 (65 FE) MG EC tablet TAKE 1 TABLET BY MOUTH EVERY DAY WITH BREAKFAST (Patient not taking: Reported on 09/09/2022) 90 tablet 1   No current facility-administered medications for this visit.     REVIEW OF SYSTEMS:   Constitutional: ( - ) fevers, ( - )  chills , ( - ) night sweats Eyes: ( - ) blurriness of vision, ( - ) double vision, ( - )  watery eyes Ears, nose, mouth, throat, and face: ( - ) mucositis, ( - ) sore throat Respiratory: ( - ) cough, ( - ) dyspnea, ( - ) wheezes Cardiovascular: ( - ) palpitation, ( - ) chest discomfort, ( - ) lower extremity swelling Gastrointestinal:  ( - ) nausea, ( - ) heartburn, ( -) change in bowel habits Skin: ( - ) abnormal skin rashes Lymphatics: ( - ) new lymphadenopathy, ( - ) easy bruising Neurological: ( - ) numbness, ( - ) tingling, ( - ) new weaknesses Behavioral/Psych: ( - ) mood change, ( - ) new changes  All other systems were reviewed with the patient and are negative.  PHYSICAL EXAMINATION: ECOG PERFORMANCE STATUS: 1 - Symptomatic but completely ambulatory  Vitals:   09/09/22 0843  BP: 134/82  Pulse: 99  Resp: 15  Temp: 97.6 F (36.4 C)  SpO2: 97%   Filed Weights   09/09/22 0843  Weight: 161 lb (73 kg)    GENERAL: well appearing female in NAD  SKIN: skin color, texture, turgor are normal, no rashes or significant lesions EYES: conjunctiva are pink and non-injected, sclera clear LUNGS: clear to auscultation and percussion with normal breathing effort HEART: regular rate & rhythm and no murmurs and no lower extremity edema Musculoskeletal: no cyanosis of digits and no clubbing  PSYCH: alert & oriented x 3, fluent speech NEURO: no focal motor/sensory deficits  LABORATORY DATA:  I have reviewed the data as listed    Latest Ref Rng & Units 09/09/2022    8:17 AM 05/20/2022   11:38 AM 02/11/2022    3:02 PM  CBC  WBC 4.0 - 10.5 K/uL 8.4  11.0  8.9   Hemoglobin 12.0 - 15.0 g/dL 12.1  13.3  11.9   Hematocrit 36.0 - 46.0 % 37.6  39.1  37.3   Platelets 150 - 400 K/uL 552  497  507        Latest Ref Rng & Units 09/09/2022    8:17 AM 05/20/2022   11:38 AM 02/11/2022    3:02 PM  CMP  Glucose 70 - 99 mg/dL 70  88  85    BUN 8 - 23 mg/dL '13  15  16   '$ Creatinine 0.44 - 1.00 mg/dL 0.84  0.89  0.76   Sodium 135 - 145 mmol/L 135  133  134   Potassium 3.5 - 5.1 mmol/L 3.8  4.0  4.5   Chloride 98 - 111 mmol/L 101  100  102   CO2 22 - 32 mmol/L '29  27  28   '$ Calcium 8.9 - 10.3 mg/dL 9.1  9.4  8.9   Total Protein 6.5 - 8.1 g/dL 7.0  7.0  6.5   Total Bilirubin 0.3 - 1.2 mg/dL 0.3  0.4  0.3   Alkaline Phos 38 - 126 U/L 60  47  40   AST 15 - 41 U/L '19  16  16   '$ ALT 0 - 44 U/L '15  11  11      '$ ASSESSMENT & PLAN Erica Potts is a 66 y.o. female returns to the clinic for follow up of iron deficiency anemia.   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  Iron panel shows improvement with serum iron 52, TIBC 307, saturation 17%, ferritin 41 --Last received IV Monoferric 1000 mg x 1 dose 06/04/2022. --No need for additional IV iron at this time.  --  Not taking PO iron so recommend to resume ferrous sulfate 325 mg once daily with a source of vitamin C --RTC in 3 months with repeat labs  #Thrombocytosis: --Secondary to iron deficiency but possibly secondary to inflammation after recent shoulder surgery last month.  --Platelet count is 552K today.  --No indication for MPN workup at this time --continue to monitor.   No orders of the defined types were placed in this encounter.   All questions were answered. The patient knows to call the clinic with any problems, questions or concerns.  I have spent a total of 30 minutes minutes of face-to-face and non-face-to-face time, preparing to see the patient, performing a medically appropriate examination, counseling and educating the patient, ordering medications/tests, documenting clinical information in the electronic health record, and care coordination.   Dede Query, PA-C Department of Hematology/Oncology [place] at [facility] Phone: [phone]

## 2022-09-23 ENCOUNTER — Encounter: Payer: Self-pay | Admitting: Internal Medicine

## 2022-10-12 ENCOUNTER — Ambulatory Visit (INDEPENDENT_AMBULATORY_CARE_PROVIDER_SITE_OTHER): Payer: Medicare Other | Admitting: Internal Medicine

## 2022-10-12 ENCOUNTER — Encounter: Payer: Self-pay | Admitting: Internal Medicine

## 2022-10-12 VITALS — BP 118/82 | HR 78 | Ht 65.0 in | Wt 167.5 lb

## 2022-10-12 DIAGNOSIS — K219 Gastro-esophageal reflux disease without esophagitis: Secondary | ICD-10-CM | POA: Diagnosis not present

## 2022-10-12 DIAGNOSIS — Q2730 Arteriovenous malformation, site unspecified: Secondary | ICD-10-CM

## 2022-10-12 DIAGNOSIS — Z7189 Other specified counseling: Secondary | ICD-10-CM

## 2022-10-12 DIAGNOSIS — D509 Iron deficiency anemia, unspecified: Secondary | ICD-10-CM | POA: Diagnosis not present

## 2022-10-12 DIAGNOSIS — Z87898 Personal history of other specified conditions: Secondary | ICD-10-CM | POA: Diagnosis not present

## 2022-10-12 MED ORDER — PANTOPRAZOLE SODIUM 40 MG PO TBEC: 40.0000 mg | DELAYED_RELEASE_TABLET | Freq: Two times a day (BID) | ORAL | 3 refills | Status: DC

## 2022-10-12 NOTE — Patient Instructions (Signed)
_______________________________________________________  If you are age 66 or older, your body mass index should be between 23-30. Your Body mass index is 27.87 kg/m. If this is out of the aforementioned range listed, please consider follow up with your Primary Care Provider.  If you are age 88 or younger, your body mass index should be between 19-25. Your Body mass index is 27.87 kg/m. If this is out of the aformentioned range listed, please consider follow up with your Primary Care Provider.   ________________________________________________________  The [place] [facility] providers would like to encourage you to use [facility] to communicate with providers for non-urgent requests or questions.  Due to long hold times on the telephone, sending your provider a message by [facility] may be a faster and more efficient way to get a response.  Please allow 48 business hours for a response.  Please remember that this is for non-urgent requests.  _______________________________________________________  We have sent the following medications to your pharmacy for you to pick up at your convenience:  Pantoprazole  Stop Carafate

## 2022-10-12 NOTE — Progress Notes (Signed)
HISTORY OF PRESENT ILLNESS:  Erica Potts is a 66 y.o. 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  She presents today to discuss her medications.  She underwent work-up for iron deficiency anemia September 2022.  Colonoscopy revealed a diminutive adenoma, left-sided diverticulosis, and a nonbleeding AVM.  Follow-up in 7 years recommended.  Upper endoscopy revealed an oozing vascular lesion which was treated with endoscopic hemostatic therapy.  Subsequent follow-up endoscopy revealed no residual lesions or bleeding.  For persistent iron deficiency she was sent to hematology.  She received iron replacement.  Hemoglobin is a bit normal.  Last hematology evaluation in their office as recently as September 09, 2022.  Reviewed.  Her last hemoglobin from that date 12.1.  Patient tells me that she wonders if it is best for her to stay on her PPI.  Currently on twice daily pantoprazole 40 mg.  She does have significant GERD symptoms off PPI.  Not sure how symptomatic on once daily dosage.  She is also been on Carafate.  She does take iron.  No dysphagia.  She continues to take meloxicam daily for joint pain.  GI review of systems is otherwise negative.  She is excited about upcoming trip to [place]:  All non-GI ROS negative unless otherwise stated in the HPI except for back pain, skin rash, sleeping problems  Past Medical History:  Diagnosis Date   Anxiety    Clostridium difficile infection    Colitis    DDD (degenerative disc disease), lumbar    Depression    HLD (hyperlipidemia)    HTN (hypertension)    IDA (iron deficiency anemia)    Insomnia    Melanoma ([place]) 2000's   Ovarian cyst, left    Palpitations    Spinal stenosis     Past Surgical History:  Procedure Laterality Date   BREAST SURGERY     benign nodule    CESAREAN SECTION  1983, 1986   x 2   COLONOSCOPY     [place] OF UTERUS  2012   ESOPHAGOGASTRODUODENOSCOPY (EGD) WITH PROPOFOL N/A 08/17/2021   Procedure: ESOPHAGOGASTRODUODENOSCOPY (EGD) WITH PROPOFOL;  Surgeon: Irene Shipper, MD;  Location: [facility];  Service: Endoscopy;  Laterality: N/A;   LAPAROSCOPIC TOTAL HYSTERECTOMY  2013   heavy bleeding   MELANOMA EXCISION     MENISCUS REPAIR Left 2011   MOUTH SURGERY Right    maxilary   nipple biopsy     OVARIAN CYST REMOVAL  1990   POLYPECTOMY     SHOULDER ARTHROSCOPY DISTAL CLAVICLE EXCISION AND OPEN ROTATOR CUFF REPAIR     Aug 12 2022   TUBAL LIGATION     UPPER GASTROINTESTINAL ENDOSCOPY      Social History Erica Potts  reports that she quit smoking about 30 years ago. Her smoking use included cigarettes. She smoked an average of 1 pack per day. She has never used smokeless tobacco. She reports current alcohol use of about 7.0 standard drinks of alcohol per week. She reports that she does not use drugs.  family history includes Colon polyps (age of onset: 27) in her father; Dementia in her father; Esophageal cancer in her paternal grandfather; Heart attack in her mother; Lung cancer in her paternal grandfather; Prostate cancer in her father.  No Known Allergies     PHYSICAL EXAMINATION:  Vital signs: BP 118/82   Pulse 78   Ht '5\' 5"'$  (1.651 m)   Wt 167 lb 8 oz (76 kg)   BMI 27.87 kg/m   Constitutional: generally well-appearing, no acute distress Psychiatric: alert and oriented x3, cooperative Eyes: extraocular movements intact, anicteric, conjunctiva pink Mouth: oral pharynx moist, no lesions Neck: supple no lymphadenopathy Cardiovascular: heart regular rate and rhythm, no murmur Lungs: clear to auscultation bilaterally Abdomen: soft, nontender, nondistended, no obvious ascites, no peritoneal signs, normal bowel sounds, no organomegaly Rectal: Omitted Extremities: no clubbing, cyanosis, or lower  extremity edema bilaterally Skin: no lesions on visible extremities Neuro: No focal deficits.  Cranial nerves intact  ASSESSMENT:  1.  Chronic GERD.  Has required PPI to control symptoms. 2.  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  Being followed by hematology.  Normal hemoglobin 3.  History of diminutive adenoma 2022. 4.  History of ulcer disease 5.  General medical problems.  Stable   PLAN:  1.  Reflux precautions 2.  Stop Carafate 3.  Decrease pantoprazole to 40 mg once daily.  If this is adequate to control reflux symptoms, then continue (as we know she has symptoms off PPI).  Furthermore, she continues to take meloxicam daily and is at risk for recurrent ulcer disease off PPI.  However, if she has significant reflux symptoms on once daily dosage, then resume twice daily dosage to control symptoms.  She understands.  She agrees.  We have refilled her pantoprazole prescription.  Medication risks reviewed in detail. 4.  Ongoing monitoring of iron deficiency anemia and its management per hematology 5.  Surveillance colonoscopy around September 2029 6.  Interval GI follow-up as needed Total time of 30 minutes was spent preparing to see the patient, reviewing outside data, obtaining interval history, performing medically appropriate physical examination, counseling and educating the patient regarding the above listed issues, ordering medication, and documenting clinical information in the health record

## 2022-12-08 ENCOUNTER — Other Ambulatory Visit: Payer: Self-pay | Admitting: Hematology and Oncology

## 2022-12-08 DIAGNOSIS — D509 Iron deficiency anemia, unspecified: Secondary | ICD-10-CM

## 2022-12-09 ENCOUNTER — Inpatient Hospital Stay (HOSPITAL_BASED_OUTPATIENT_CLINIC_OR_DEPARTMENT_OTHER): Payer: Medicare Other | Admitting: Hematology and Oncology

## 2022-12-09 ENCOUNTER — Inpatient Hospital Stay: Payer: Medicare Other | Attending: Physician Assistant

## 2022-12-09 VITALS — BP 132/90 | HR 93 | Temp 98.0°F | Resp 16 | Wt 171.4 lb

## 2022-12-09 DIAGNOSIS — Z7982 Long term (current) use of aspirin: Secondary | ICD-10-CM | POA: Insufficient documentation

## 2022-12-09 DIAGNOSIS — D75839 Thrombocytosis, unspecified: Secondary | ICD-10-CM | POA: Diagnosis not present

## 2022-12-09 DIAGNOSIS — D509 Iron deficiency anemia, unspecified: Secondary | ICD-10-CM

## 2022-12-09 DIAGNOSIS — Z87891 Personal history of nicotine dependence: Secondary | ICD-10-CM | POA: Insufficient documentation

## 2022-12-09 DIAGNOSIS — Q2739 Arteriovenous malformation, other site: Secondary | ICD-10-CM | POA: Diagnosis present

## 2022-12-09 DIAGNOSIS — D500 Iron deficiency anemia secondary to blood loss (chronic): Secondary | ICD-10-CM | POA: Diagnosis present

## 2022-12-09 DIAGNOSIS — Z79899 Other long term (current) drug therapy: Secondary | ICD-10-CM | POA: Diagnosis not present

## 2022-12-09 LAB — IRON AND IRON BINDING CAPACITY (CC-WL,HP ONLY)
Iron: 19 ug/dL — ABNORMAL LOW (ref 28–170)
Saturation Ratios: 5 % — ABNORMAL LOW (ref 10.4–31.8)
TIBC: 403 ug/dL (ref 250–450)
UIBC: 384 ug/dL (ref 148–442)

## 2022-12-09 LAB — RETIC PANEL
Immature Retic Fract: 12 % (ref 2.3–15.9)
RBC.: 4.31 MIL/uL (ref 3.87–5.11)
Retic Count, Absolute: 53 10*3/uL (ref 19.0–186.0)
Retic Ct Pct: 1.2 % (ref 0.4–3.1)
Reticulocyte Hemoglobin: 27.9 pg — ABNORMAL LOW (ref >27.9)

## 2022-12-09 LAB — CBC WITH DIFFERENTIAL (CANCER CENTER ONLY)
Abs Immature Granulocytes: 0.03 10*3/uL (ref 0.00–0.07)
Basophils Absolute: 0.1 10*3/uL (ref 0.0–0.1)
Basophils Relative: 1 %
Eosinophils Absolute: 0.3 10*3/uL (ref 0.0–0.5)
Eosinophils Relative: 4 %
HCT: 38.3 % (ref 36.0–46.0)
Hemoglobin: 12.6 g/dL (ref 12.0–15.0)
Immature Granulocytes: 0 %
Lymphocytes Relative: 22 %
Lymphs Abs: 1.7 10*3/uL (ref 0.7–4.0)
MCH: 29 pg (ref 26.0–34.0)
MCHC: 32.9 g/dL (ref 30.0–36.0)
MCV: 88 fL (ref 80.0–100.0)
Monocytes Absolute: 0.7 10*3/uL (ref 0.1–1.0)
Monocytes Relative: 9 %
Neutro Abs: 5 10*3/uL (ref 1.7–7.7)
Neutrophils Relative %: 64 %
Platelet Count: 592 10*3/uL — ABNORMAL HIGH (ref 150–400)
RBC: 4.35 MIL/uL (ref 3.87–5.11)
RDW: 15.3 % (ref 11.5–15.5)
WBC Count: 7.8 10*3/uL (ref 4.0–10.5)
nRBC: 0 % (ref 0.0–0.2)

## 2022-12-09 LAB — CMP (CANCER CENTER ONLY)
ALT: 14 U/L (ref 0–44)
AST: 20 U/L (ref 15–41)
Albumin: 4 g/dL (ref 3.5–5.0)
Alkaline Phosphatase: 61 U/L (ref 38–126)
Anion gap: 4 — ABNORMAL LOW (ref 5–15)
BUN: 11 mg/dL (ref 8–23)
CO2: 29 mmol/L (ref 22–32)
Calcium: 9.1 mg/dL (ref 8.9–10.3)
Chloride: 103 mmol/L (ref 98–111)
Creatinine: 0.89 mg/dL (ref 0.44–1.00)
GFR, Estimated: >60 mL/min (ref >60)
Glucose, Bld: 65 mg/dL — ABNORMAL LOW (ref 70–99)
Potassium: 4.2 mmol/L (ref 3.5–5.1)
Sodium: 136 mmol/L (ref 135–145)
Total Bilirubin: 0.3 mg/dL (ref 0.3–1.2)
Total Protein: 6.5 g/dL (ref 6.5–8.1)

## 2022-12-09 LAB — FERRITIN: Ferritin: 10 ng/mL — ABNORMAL LOW (ref 11–307)

## 2022-12-09 NOTE — Progress Notes (Signed)
[facility] [phone]   [phone]  PROGRESS NOTE  Patient Care Team: Donnajean Lopes, MD as PCP - General (Internal Medicine)  CHIEF COMPLAINTS/PURPOSE OF CONSULTATION:  Iron deficiency anemia  HISTORY OF PRESENTING ILLNESS:  Erica Potts 67 y.o. female returns for a follow up for iron deficiency anemia. She is unaccompanied for this visit. She was last seen 09/09/2022.    Erica Potts reports she has been "okay" in the interim since her last visit.  She does report a decline in her energy levels.  She ranks her energy right now as a 6 out of 10.  She notes that she is also taking Cymbalta and Wellbutrin and thinks that that may also be lowering her energy levels.  Her appetite is good and she is doing her best to eat red meat.  She notes that she continues on p.o. iron therapy.  The medication does cause some constipation but she is taking Colace for that.  She is not having any overt signs of bleeding such as dark tarry stools, nosebleeds, or gum bleeding.  She is also taking baby aspirin 81 mg p.o. daily.  Otherwise she denies any fevers, chills, sweats, nausea, vomiting or diarrhea.  A full 10 point ROS is otherwise negative.  She has no other complaints, questions or concerns.. Rest of the 10 point ROS is below.   MEDICAL HISTORY:  Past Medical History:  Diagnosis Date   Anxiety    Clostridium difficile infection    Colitis    DDD (degenerative disc disease), lumbar    Depression    HLD (hyperlipidemia)    HTN (hypertension)    IDA (iron deficiency anemia)    Insomnia    Melanoma ([place]) 2000's   Ovarian cyst, left    Palpitations    Spinal stenosis     SURGICAL HISTORY: Past Surgical History:  Procedure Laterality Date   BREAST SURGERY     benign nodule   CESAREAN SECTION  1983, 1986   x 2   COLONOSCOPY     DIL[place] AND CURETTAGE OF UTERUS  2012   ESOPHAGOGASTRODUODENOSCOPY (EGD) WITH PROPOFOL N/A 08/17/2021    Procedure: ESOPHAGOGASTRODUODENOSCOPY (EGD) WITH PROPOFOL;  Surgeon: Irene Shipper, MD;  Location: [facility];  Service: Endoscopy;  Laterality: N/A;   LAPAROSCOPIC TOTAL HYSTERECTOMY  2013   heavy bleeding   MELANOMA EXCISION     MENISCUS REPAIR Left 2011   MOUTH SURGERY Right    maxilary   nipple biopsy     OVARIAN CYST REMOVAL  1990   POLYPECTOMY     SHOULDER ARTHROSCOPY DISTAL CLAVICLE EXCISION AND OPEN ROTATOR CUFF REPAIR     Aug 12 2022   TUBAL LIGATION     UPPER GASTROINTESTINAL ENDOSCOPY      SOCIAL HISTORY: Social History   Socioeconomic History   Marital status: Divorced    Spouse name: Not on file   Number of children: 2   Years of education: Not on file   Highest education level: Not on file  Occupational History   Occupation: RN    Comment: [employer].  Tobacco Use   Smoking status: Former    Packs/day: 1.00    Types: Cigarettes    Quit date: 11/17/1991    Years since quitting: 31.1   Smokeless tobacco: Never  Vaping Use   Vaping Use: Never used  Substance and Sexual Activity   Alcohol use: Yes    Alcohol/week: 7.0 standard drinks of alcohol  Types: 7 Standard drinks or equivalent per week    Comment: daily   Drug use: No   Sexual activity: Not on file  Other Topics Concern   Not on file  Social History Narrative   Not on file   Social Determinants of Health   Financial Resource Strain: Not on file  Food Insecurity: Not on file  Transportation Needs: Not on file  Physical Activity: Not on file  Stress: Not on file  Social Connections: Not on file  Intimate Partner Violence: Not on file    FAMILY HISTORY: Family History  Problem Relation Age of Onset   Heart attack Mother        possible blood disorder/cancer   Prostate cancer Father    Colon polyps Father 40   Dementia Father    Esophageal cancer Paternal Grandfather    Lung cancer Paternal Grandfather    Colon cancer Neg Hx    Rectal cancer Neg Hx    Stomach cancer Neg Hx     Pancreatic cancer Neg Hx    Liver disease Neg Hx     ALLERGIES:  has No Known Allergies.  MEDICATIONS:  Current Outpatient Medications  Medication Sig Dispense Refill   ALPRAZolam (XANAX) 0.5 MG tablet Take 0.5 mg by mouth at bedtime as needed for sleep. Take 1/2 -1 tab at bedtime as needed     aspirin EC 81 MG tablet Take 81 mg by mouth daily.     buPROPion (WELLBUTRIN SR) 150 MG 12 hr tablet Take 150 mg by mouth every morning.     buPROPion (WELLBUTRIN) 75 MG tablet Take 75 mg by mouth every morning.     cyclobenzaprine (FLEXERIL) 10 MG tablet Take 10 mg by mouth at bedtime.     docusate sodium (COLACE) 100 MG capsule Take 100 mg by mouth 2 (two) times daily.     DULoxetine (CYMBALTA) 30 MG capsule Take 30 mg by mouth daily.     estradiol (VIVELLE-DOT) 0.1 MG/24HR patch Place 1 patch onto the skin 2 (two) times a week.     gabapentin (NEURONTIN) 300 MG capsule Take 600 mg by mouth at bedtime. Take two capsules by mouth at bedtime as needed     HYDROcodone-acetaminophen (NORCO) 7.5-325 MG per tablet Take 2 tablets by mouth 3 (three) times daily as needed for pain. Patient only taking 2 tablets three times daily     hydrocortisone 2.5 % ointment Apply 1 g topically 3 (three) times daily.     lisinopril (PRINIVIL,ZESTRIL) 10 MG tablet Take 10 mg by mouth daily.     meloxicam (MOBIC) 15 MG tablet Take 15 mg by mouth daily.     Multiple Vitamin (MULTIVITAMIN) tablet Take 1 tablet by mouth daily.     Omega-3 Fatty Acids (FISH OIL) 1000 MG CAPS Take 2 capsules by mouth daily.     pantoprazole (PROTONIX) 40 MG tablet Take 1 tablet (40 mg total) by mouth 2 (two) times daily. 180 tablet 3   saccharomyces boulardii (FLORASTOR) 250 MG capsule Take 500 mg by mouth daily.      tretinoin (RETIN-A) 0.025 % cream tretinoin 0.025 % topical cream  APPLY TO AFFECTED AREA EVERY DAY AT NIGHT     VITAMIN D PO Take 2,000 Units by mouth daily.      zolpidem (AMBIEN) 10 MG tablet Take 10 mg by mouth at  bedtime as needed for sleep.     ferrous sulfate 325 (65 FE) MG EC tablet TAKE 1 TABLET  BY MOUTH EVERY DAY WITH BREAKFAST (Patient not taking: Reported on 12/09/2022) 90 tablet 1   No current facility-administered medications for this visit.    REVIEW OF SYSTEMS:   Constitutional: ( - ) fevers, ( - )  chills , ( - ) night sweats Eyes: ( - ) blurriness of vision, ( - ) double vision, ( - ) watery eyes Ears, nose, mouth, throat, and face: ( - ) mucositis, ( - ) sore throat Respiratory: ( - ) cough, ( - ) dyspnea, ( - ) wheezes Cardiovascular: ( - ) palpitation, ( - ) chest discomfort, ( - ) lower extremity swelling Gastrointestinal:  ( - ) nausea, ( - ) heartburn, ( -) change in bowel habits Skin: ( - ) abnormal skin rashes Lymphatics: ( - ) new lymphadenopathy, ( - ) easy bruising Neurological: ( - ) numbness, ( - ) tingling, ( - ) new weaknesses Behavioral/Psych: ( - ) mood change, ( - ) new changes  All other systems were reviewed with the patient and are negative.  PHYSICAL EXAMINATION: ECOG PERFORMANCE STATUS: 1 - Symptomatic but completely ambulatory  Vitals:   12/09/22 1139  BP: (!) 132/90  Pulse: 93  Resp: 16  Temp: 98 F (36.7 C)  SpO2: 99%   Filed Weights   12/09/22 1139  Weight: 171 lb 6.4 oz (77.7 kg)    GENERAL: well appearing female in NAD  SKIN: skin color, texture, turgor are normal, no rashes or significant lesions EYES: conjunctiva are pink and non-injected, sclera clear LUNGS: clear to auscultation and percussion with normal breathing effort HEART: regular rate & rhythm and no murmurs and no lower extremity edema Musculoskeletal: no cyanosis of digits and no clubbing  PSYCH: alert & oriented x 3, fluent speech NEURO: no focal motor/sensory deficits  LABORATORY DATA:  I have reviewed the data as listed    Latest Ref Rng & Units 12/09/2022   10:40 AM 09/09/2022    8:17 AM 05/20/2022   11:38 AM  CBC  WBC 4.0 - 10.5 K/uL 7.8  8.4  11.0   Hemoglobin 12.0 -  15.0 g/dL 12.6  12.1  13.3   Hematocrit 36.0 - 46.0 % 38.3  37.6  39.1   Platelets 150 - 400 K/uL 592  552  497        Latest Ref Rng & Units 12/09/2022   10:40 AM 09/09/2022    8:17 AM 05/20/2022   11:38 AM  CMP  Glucose 70 - 99 mg/dL 65  70  88   BUN 8 - 23 mg/dL 11  13  15   $ Creatinine 0.44 - 1.00 mg/dL 0.89  0.84  0.89   Sodium 135 - 145 mmol/L 136  135  133   Potassium 3.5 - 5.1 mmol/L 4.2  3.8  4.0   Chloride 98 - 111 mmol/L 103  101  100   CO2 22 - 32 mmol/L 29  29  27   $ Calcium 8.9 - 10.3 mg/dL 9.1  9.1  9.4   Total Protein 6.5 - 8.1 g/dL 6.5  7.0  7.0   Total Bilirubin 0.3 - 1.2 mg/dL 0.3  0.3  0.4   Alkaline Phos 38 - 126 U/L 61  60  47   AST 15 - 41 U/L 20  19  16   $ ALT 0 - 44 U/L 14  15  11      $ ASSESSMENT & PLAN CYNDRA GUNDY is a 67 y.o. female returns to the  clinic for follow up of iron deficiency anemia.   #Iron deficiency anemia 2/2 AVMs of the GI tract --Labs today show no evidence of anemia with hemoglobin 12.6.  Iron panel pending.  --Last received IV Monoferric 1000 mg x 1 dose 06/04/2022. -- Iron sat 5%, ferritin pending. Likely patient will require IV iron therapy.  --Not taking PO iron so recommend to resume ferrous sulfate 325 mg once daily with a source of vitamin C --RTC in 3 months with repeat labs  #Thrombocytosis: --likely secondary to iron deficiency, though MPN could be a consideration.   --Platelet count is 592K today.  --No indication for MPN workup at this time, though would consider MPN testing if thrombocytosis persists despite correction of iron deficiency. --continue to monitor.   No orders of the defined types were placed in this encounter.   All questions were answered. The patient knows to call the clinic with any problems, questions or concerns.  I have spent a total of 30 minutes minutes of face-to-face and non-face-to-face time, preparing to see the patient, performing a medically appropriate examination, counseling and  educating the patient, ordering medications/tests, documenting clinical information in the electronic health record, and care coordination.   Ledell Peoples, MD Department of Hematology/Oncology [place] [facility] Phone: [phone] Pager: [phone] Email: [email or web address]

## 2022-12-23 ENCOUNTER — Telehealth: Payer: Self-pay

## 2022-12-23 NOTE — Telephone Encounter (Addendum)
Spoke with patient to advise of message below. She will take her iron with a source of vitamin C and will wait to see what labs are like at her visit in May. She does not want an iron infusion at this time.  ----- Message from Orson Slick, MD sent at 12/23/2022  8:24 AM EST ----- Please let Erica Potts know that her labs are consistent with persistent iron deficiency. Please assure she is taking her PO ferrous sulfate 373m daily with a source of vitamin C. If she is not able/willing to take the PO iron we can set her up for additional iron infusions if she would like. Follow up is scheduled in May 2024.    ----- Message ----- From: [facility] [place]: 12/09/2022  10:51 AM EST To: JOrson Slick MD

## 2022-12-25 ENCOUNTER — Encounter: Payer: Self-pay | Admitting: Hematology and Oncology

## 2022-12-27 ENCOUNTER — Telehealth: Payer: Self-pay | Admitting: Pharmacy Technician

## 2022-12-27 NOTE — Telephone Encounter (Signed)
Auth Submission: NO AUTH NEEDED Payer: medicare a/b & bcbs supp Medication & CPT/J Code(s) submitted: Monoferric (Ferrci derisomaltose) [phone] Route of submission (phone, fax, portal):  Phone # Fax # Auth type: Buy/Bill Units/visits requested: 1 Reference number:  Approval from: 12/27/22 to 12/28/23

## 2023-01-06 ENCOUNTER — Encounter: Payer: Self-pay | Admitting: Hematology and Oncology

## 2023-01-06 ENCOUNTER — Ambulatory Visit
Admission: RE | Admit: 2023-01-06 | Discharge: 2023-01-06 | Disposition: A | Discharge status: Home / Self Care | Payer: Medicare Other | Source: Ambulatory Visit | Attending: Internal Medicine | Admitting: Internal Medicine

## 2023-01-06 ENCOUNTER — Ambulatory Visit
Admission: RE | Admit: 2023-01-06 | Discharge: 2023-01-06 | Disposition: A | Discharge status: Home / Self Care | Payer: BLUE CROSS/BLUE SHIELD | Source: Ambulatory Visit | Attending: Internal Medicine | Admitting: Internal Medicine

## 2023-01-06 DIAGNOSIS — R928 Other abnormal and inconclusive findings on diagnostic imaging of breast: Secondary | ICD-10-CM

## 2023-01-16 ENCOUNTER — Ambulatory Visit (INDEPENDENT_AMBULATORY_CARE_PROVIDER_SITE_OTHER): Payer: Medicare Other

## 2023-01-16 VITALS — BP 114/74 | HR 81 | Temp 98.1°F | Resp 18 | Ht 66.0 in | Wt 169.2 lb

## 2023-01-16 DIAGNOSIS — Q2730 Arteriovenous malformation, site unspecified: Secondary | ICD-10-CM

## 2023-01-16 DIAGNOSIS — D500 Iron deficiency anemia secondary to blood loss (chronic): Secondary | ICD-10-CM | POA: Diagnosis not present

## 2023-01-16 DIAGNOSIS — D508 Other iron deficiency anemias: Secondary | ICD-10-CM

## 2023-01-16 MED ORDER — SODIUM CHLORIDE 0.9 % IV SOLN
1000.0000 mg | Freq: Once | INTRAVENOUS | Status: AC
  Administered 2023-01-16: 1000 mg via INTRAVENOUS
  Filled 2023-01-16: qty 10

## 2023-01-16 NOTE — Progress Notes (Signed)
Diagnosis: Iron Deficiency Anemia  Provider:  Marshell Garfinkel MD  Procedure: Infusion  IV Type: Peripheral, IV Location: L Antecubital  Monoferric (Ferric Derisomaltose), Dose: 1000 mg  Infusion Start Time: [id number]  Infusion Stop Time: [id number]  Post Infusion IV Care: Patient declined observation and Peripheral IV Discontinued  Discharge: Condition: Good, Destination: Home . AVS Provided and AVS Declined  Performed by:  Cleophus Molt, RN

## 2023-02-10 ENCOUNTER — Other Ambulatory Visit: Payer: Self-pay | Admitting: Internal Medicine

## 2023-02-10 DIAGNOSIS — M545 Low back pain, unspecified: Secondary | ICD-10-CM

## 2023-02-25 ENCOUNTER — Other Ambulatory Visit: Payer: Medicare Other

## 2023-02-27 ENCOUNTER — Other Ambulatory Visit: Payer: Medicare Other

## 2023-03-03 ENCOUNTER — Other Ambulatory Visit: Payer: Medicare Other

## 2023-03-06 ENCOUNTER — Telehealth: Payer: Self-pay | Admitting: Physician Assistant

## 2023-03-07 ENCOUNTER — Telehealth: Payer: Self-pay | Admitting: Physician Assistant

## 2023-03-09 ENCOUNTER — Other Ambulatory Visit: Payer: Self-pay | Admitting: Physician Assistant

## 2023-03-10 ENCOUNTER — Inpatient Hospital Stay: Payer: Medicare Other | Admitting: Physician Assistant

## 2023-03-10 ENCOUNTER — Inpatient Hospital Stay: Payer: Medicare Other

## 2023-03-16 ENCOUNTER — Other Ambulatory Visit: Payer: Self-pay | Admitting: Physician Assistant

## 2023-03-16 DIAGNOSIS — D509 Iron deficiency anemia, unspecified: Secondary | ICD-10-CM

## 2023-03-17 ENCOUNTER — Inpatient Hospital Stay: Payer: Medicare Other | Attending: Physician Assistant

## 2023-03-17 ENCOUNTER — Inpatient Hospital Stay (HOSPITAL_BASED_OUTPATIENT_CLINIC_OR_DEPARTMENT_OTHER): Payer: Medicare Other | Admitting: Physician Assistant

## 2023-03-17 ENCOUNTER — Other Ambulatory Visit: Payer: Self-pay

## 2023-03-17 ENCOUNTER — Telehealth: Payer: Self-pay

## 2023-03-17 VITALS — BP 140/92 | HR 85 | Temp 97.7°F | Resp 16 | Wt 168.9 lb

## 2023-03-17 DIAGNOSIS — Q2739 Arteriovenous malformation, other site: Secondary | ICD-10-CM | POA: Insufficient documentation

## 2023-03-17 DIAGNOSIS — Z87891 Personal history of nicotine dependence: Secondary | ICD-10-CM | POA: Insufficient documentation

## 2023-03-17 DIAGNOSIS — D509 Iron deficiency anemia, unspecified: Secondary | ICD-10-CM | POA: Diagnosis not present

## 2023-03-17 DIAGNOSIS — D75839 Thrombocytosis, unspecified: Secondary | ICD-10-CM | POA: Insufficient documentation

## 2023-03-17 DIAGNOSIS — D500 Iron deficiency anemia secondary to blood loss (chronic): Secondary | ICD-10-CM | POA: Diagnosis present

## 2023-03-17 DIAGNOSIS — Z79899 Other long term (current) drug therapy: Secondary | ICD-10-CM | POA: Insufficient documentation

## 2023-03-17 LAB — CBC WITH DIFFERENTIAL (CANCER CENTER ONLY)
Abs Immature Granulocytes: 0.04 10*3/uL (ref 0.00–0.07)
Basophils Absolute: 0 10*3/uL (ref 0.0–0.1)
Basophils Relative: 1 %
Eosinophils Absolute: 0.1 10*3/uL (ref 0.0–0.5)
Eosinophils Relative: 1 %
HCT: 40.7 % (ref 36.0–46.0)
Hemoglobin: 13.6 g/dL (ref 12.0–15.0)
Immature Granulocytes: 1 %
Lymphocytes Relative: 29 %
Lymphs Abs: 2.6 10*3/uL (ref 0.7–4.0)
MCH: 30.7 pg (ref 26.0–34.0)
MCHC: 33.4 g/dL (ref 30.0–36.0)
MCV: 91.9 fL (ref 80.0–100.0)
Monocytes Absolute: 0.7 10*3/uL (ref 0.1–1.0)
Monocytes Relative: 8 %
Neutro Abs: 5.4 10*3/uL (ref 1.7–7.7)
Neutrophils Relative %: 60 %
Platelet Count: 533 10*3/uL — ABNORMAL HIGH (ref 150–400)
RBC: 4.43 MIL/uL (ref 3.87–5.11)
RDW: 17.7 % — ABNORMAL HIGH (ref 11.5–15.5)
WBC Count: 8.8 10*3/uL (ref 4.0–10.5)
nRBC: 0 % (ref 0.0–0.2)

## 2023-03-17 LAB — FERRITIN: Ferritin: 147 ng/mL (ref 11–307)

## 2023-03-17 LAB — IRON AND IRON BINDING CAPACITY (CC-WL,HP ONLY)
Iron: 92 ug/dL (ref 28–170)
Saturation Ratios: 32 % — ABNORMAL HIGH (ref 10.4–31.8)
TIBC: 284 ug/dL (ref 250–450)
UIBC: 192 ug/dL (ref 148–442)

## 2023-03-17 NOTE — Telephone Encounter (Signed)
-----   Message from Briant Cedar, PA-C sent at 03/17/2023  3:49 PM EDT ----- Please notify patient that iron levels are normal. No need for additional IV iron at this time. We will schedule labs only in 3 months and visit in 6 months.

## 2023-03-17 NOTE — Telephone Encounter (Signed)
Pt advised with VU 

## 2023-03-17 NOTE — Progress Notes (Signed)
[facility] [phone]   [phone]  PROGRESS NOTE  Patient Care Team: Garlan Fillers, MD as PCP - General (Internal Medicine)  CHIEF COMPLAINTS/PURPOSE OF CONSULTATION:  Iron deficiency anemia  HISTORY OF PRESENTING ILLNESS:  Erica Potts 67 y.o. female returns for a follow up for iron deficiency anemia. She is unaccompanied for this visit. She was last seen 12/09/2022. In the interim since the last visit, she received IV monoferric 1000 mg x 1 dose on 01/16/2023.   Erica Potts reports that her energy levels did improve with IV iron but has recently declined. She does add that she  has increased stress from work and ongoing neck pain. She has an upcoming appt with ortho specialist on Monday to hopefully get an injection for the pain. She denies any shortness of breath or dizziness. Her appetite is stable and she denies any weight changes. She is compliant with taking her iron pills every other day with minimal constipation. She denies any signs of bleeding including hematochezia or melena. She denies fevers, chills, sweats, shortness of breath, chest pain or cough. A full 10 point ROS is otherwise negative.  She has no other complaints, questions or concerns.. Rest of the 10 point ROS is below.   MEDICAL HISTORY:  Past Medical History:  Diagnosis Date   Anxiety    Clostridium difficile infection    Colitis    DDD (degenerative disc disease), lumbar    Depression    HLD (hyperlipidemia)    HTN (hypertension)    IDA (iron deficiency anemia)    Insomnia    Melanoma (HCC) 2000's   Ovarian cyst, left    Palpitations    Spinal stenosis     SURGICAL HISTORY: Past Surgical History:  Procedure Laterality Date   BREAST SURGERY     benign nodule   CESAREAN SECTION  1983, 1986   x 2   COLONOSCOPY     DILATION AND CURETTAGE OF UTERUS  2012   ESOPHAGOGASTRODUODENOSCOPY (EGD) WITH PROPOFOL N/A 08/17/2021   Procedure:  ESOPHAGOGASTRODUODENOSCOPY (EGD) WITH PROPOFOL;  Surgeon: Hilarie Fredrickson, MD;  Location: [facility];  Service: Endoscopy;  Laterality: N/A;   LAPAROSCOPIC TOTAL HYSTERECTOMY  2013   heavy bleeding   MELANOMA EXCISION     MENISCUS REPAIR Left 2011   MOUTH SURGERY Right    maxilary   nipple biopsy     OVARIAN CYST REMOVAL  1990   POLYPECTOMY     SHOULDER ARTHROSCOPY DISTAL CLAVICLE EXCISION AND OPEN ROTATOR CUFF REPAIR     Aug 12 2022   TUBAL LIGATION     UPPER GASTROINTESTINAL ENDOSCOPY      SOCIAL HISTORY: Social History   Socioeconomic History   Marital status: Divorced    Spouse name: Not on file   Number of children: 2   Years of education: Not on file   Highest education level: Not on file  Occupational History   Occupation: RN    Comment: [employer].  Tobacco Use   Smoking status: Former    Packs/day: 1    Types: Cigarettes    Quit date: 11/17/1991    Years since quitting: 31.3   Smokeless tobacco: Never  Vaping Use   Vaping Use: Never used  Substance and Sexual Activity   Alcohol use: Yes    Alcohol/week: 7.0 standard drinks of alcohol    Types: 7 Standard drinks or equivalent per week    Comment: daily   Drug use: No  Sexual activity: Not on file  Other Topics Concern   Not on file  Social History Narrative   Not on file   Social Determinants of Health   Financial Resource Strain: Not on file  Food Insecurity: Not on file  Transportation Needs: Not on file  Physical Activity: Not on file  Stress: Not on file  Social Connections: Not on file  Intimate Partner Violence: Not on file    FAMILY HISTORY: Family History  Problem Relation Age of Onset   Heart attack Mother        possible blood disorder/cancer   Prostate cancer Father    Colon polyps Father 97   Dementia Father    Esophageal cancer Paternal Grandfather    Lung cancer Paternal Grandfather    Colon cancer Neg Hx    Rectal cancer Neg Hx    Stomach cancer Neg Hx     Pancreatic cancer Neg Hx    Liver disease Neg Hx     ALLERGIES:  has No Known Allergies.  MEDICATIONS:  Current Outpatient Medications  Medication Sig Dispense Refill   ALPRAZolam (XANAX) 0.5 MG tablet Take 0.5 mg by mouth at bedtime as needed for sleep. Take 1/2 -1 tab at bedtime as needed     buPROPion (WELLBUTRIN SR) 150 MG 12 hr tablet Take 150 mg by mouth every morning.     buPROPion (WELLBUTRIN) 75 MG tablet Take 75 mg by mouth every morning.     cyclobenzaprine (FLEXERIL) 10 MG tablet Take 10 mg by mouth at bedtime.     docusate sodium (COLACE) 100 MG capsule Take 100 mg by mouth 2 (two) times daily.     DULoxetine (CYMBALTA) 30 MG capsule Take 30 mg by mouth daily.     ferrous sulfate 325 (65 FE) MG EC tablet TAKE 1 TABLET BY MOUTH EVERY DAY WITH BREAKFAST (Patient taking differently: Taking every other day) 90 tablet 1   gabapentin (NEURONTIN) 300 MG capsule Take 600 mg by mouth at bedtime. Take two capsules by mouth at bedtime as needed     HYDROcodone-acetaminophen (NORCO) 7.5-325 MG per tablet Take 2 tablets by mouth 3 (three) times daily as needed for pain. Patient only taking 2 tablets three times daily     hydrocortisone 2.5 % ointment Apply 1 g topically 3 (three) times daily.     lisinopril (PRINIVIL,ZESTRIL) 10 MG tablet Take 10 mg by mouth daily.     meloxicam (MOBIC) 15 MG tablet Take 15 mg by mouth daily.     Multiple Vitamin (MULTIVITAMIN) tablet Take 1 tablet by mouth daily.     Omega-3 Fatty Acids (FISH OIL) 1000 MG CAPS Take 2 capsules by mouth daily.     pantoprazole (PROTONIX) 40 MG tablet Take 1 tablet (40 mg total) by mouth 2 (two) times daily. 180 tablet 3   saccharomyces boulardii (FLORASTOR) 250 MG capsule Take 500 mg by mouth daily.      tretinoin (RETIN-A) 0.025 % cream tretinoin 0.025 % topical cream  APPLY TO AFFECTED AREA EVERY DAY AT NIGHT     VITAMIN D PO Take 2,000 Units by mouth daily.      zolpidem (AMBIEN) 10 MG tablet Take 10 mg by mouth at  bedtime as needed for sleep.     aspirin EC 81 MG tablet Take 81 mg by mouth daily. (Patient not taking: Reported on 03/17/2023)     estradiol (VIVELLE-DOT) 0.1 MG/24HR patch Place 1 patch onto the skin 2 (two) times a week. (  Patient not taking: Reported on 03/17/2023)     No current facility-administered medications for this visit.    REVIEW OF SYSTEMS:   Constitutional: ( - ) fevers, ( - )  chills , ( - ) night sweats Eyes: ( - ) blurriness of vision, ( - ) double vision, ( - ) watery eyes Ears, nose, mouth, throat, and face: ( - ) mucositis, ( - ) sore throat Respiratory: ( - ) cough, ( - ) dyspnea, ( - ) wheezes Cardiovascular: ( - ) palpitation, ( - ) chest discomfort, ( - ) lower extremity swelling Gastrointestinal:  ( - ) nausea, ( - ) heartburn, ( -) change in bowel habits Skin: ( - ) abnormal skin rashes Lymphatics: ( - ) new lymphadenopathy, ( - ) easy bruising Neurological: ( - ) numbness, ( - ) tingling, ( - ) new weaknesses Behavioral/Psych: ( - ) mood change, ( - ) new changes  All other systems were reviewed with the patient and are negative.  PHYSICAL EXAMINATION: ECOG PERFORMANCE STATUS: 1 - Symptomatic but completely ambulatory  Vitals:   03/17/23 0822  BP: (!) 140/92  Pulse: 85  Resp: 16  Temp: 97.7 F (36.5 C)  SpO2: 97%   Filed Weights   03/17/23 0822  Weight: 168 lb 14.4 oz (76.6 kg)    GENERAL: well appearing female in NAD  SKIN: skin color, texture, turgor are normal, no rashes or significant lesions EYES: conjunctiva are pink and non-injected, sclera clear LUNGS: clear to auscultation and percussion with normal breathing effort HEART: regular rate & rhythm and no murmurs and no lower extremity edema Musculoskeletal: no cyanosis of digits and no clubbing  PSYCH: alert & oriented x 3, fluent speech NEURO: no focal motor/sensory deficits  LABORATORY DATA:  I have reviewed the data as listed    Latest Ref Rng & Units 03/17/2023    7:58 AM 12/09/2022    10:40 AM 09/09/2022    8:17 AM  CBC  WBC 4.0 - 10.5 K/uL 8.8  7.8  8.4   Hemoglobin 12.0 - 15.0 g/dL 16.1  09.6  04.5   Hematocrit 36.0 - 46.0 % 40.7  38.3  37.6   Platelets 150 - 400 K/uL 533  592  552        Latest Ref Rng & Units 12/09/2022   10:40 AM 09/09/2022    8:17 AM 05/20/2022   11:38 AM  CMP  Glucose 70 - 99 mg/dL 65  70  88   BUN 8 - 23 mg/dL 11  13  15    Creatinine 0.44 - 1.00 mg/dL 4.09  8.11  9.14   Sodium 135 - 145 mmol/L 136  135  133   Potassium 3.5 - 5.1 mmol/L 4.2  3.8  4.0   Chloride 98 - 111 mmol/L 103  101  100   CO2 22 - 32 mmol/L 29  29  27    Calcium 8.9 - 10.3 mg/dL 9.1  9.1  9.4   Total Protein 6.5 - 8.1 g/dL 6.5  7.0  7.0   Total Bilirubin 0.3 - 1.2 mg/dL 0.3  0.3  0.4   Alkaline Phos 38 - 126 U/L 61  60  47   AST 15 - 41 U/L 20  19  16    ALT 0 - 44 U/L 14  15  11       ASSESSMENT & PLAN ARIYANNA DEVOTO is a 67 y.o. female returns to the clinic for follow up of iron  deficiency anemia.   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 --Labs today show no anemia with Hgb of 13.6, MCV 91.9. Iron panel shows no deficiency with serum ion 92, saturation 32%,ferritin 147. --Taking ferrous sulfate 325 mg once every other day with a source of vitamin C. Recommend to continue --No need for additional IV iron at this time. --RTC in 3 months for labs only and 6 months for lab/visit  #Thrombocytosis: --likely secondary to iron deficiency, though MPN could be a consideration.   --Platelet count is 533K today.  --No indication for MPN workup at this time, though would consider MPN testing if thrombocytosis persists despite correction of iron deficiency. --continue to monitor.   No orders of the defined types were placed in this encounter.   All questions were answered. The patient knows to call the clinic with any problems, questions or concerns.  I have spent a total of 25 minutes minutes  of face-to-face and non-face-to-face time, preparing to see the patient, performing a medically appropriate examination, counseling and educating the patient, ordering medications/tests, documenting clinical information in the electronic health record, and care coordination.   Georga Kaufmann PA-C Dept of Hematology and Oncology [facility] Phone: [phone]

## 2023-03-20 ENCOUNTER — Telehealth: Payer: Self-pay | Admitting: Hematology and Oncology

## 2023-05-05 ENCOUNTER — Ambulatory Visit
Admission: RE | Admit: 2023-05-05 | Discharge: 2023-05-05 | Disposition: A | Discharge status: Home / Self Care | Payer: Medicare Other | Source: Ambulatory Visit | Attending: Internal Medicine | Admitting: Internal Medicine

## 2023-05-05 DIAGNOSIS — M545 Low back pain, unspecified: Secondary | ICD-10-CM

## 2023-06-20 ENCOUNTER — Other Ambulatory Visit: Payer: Medicare Other

## 2023-07-10 IMAGING — CT CT MAXILLOFACIAL W/O CM
3 series · 16 of 47 positions shown, 19 images · non-contrast
Comparison: None

CLINICAL DATA: A 65-year-old female presents with maxillofacial
pain, laceration across nose. Also with reported bruising about the
LEFT orbit.

EXAM:
CT HEAD WITHOUT CONTRAST
CT MAXILLOFACIAL WITHOUT CONTRAST
TECHNIQUE: Multidetector CT imaging of the head and maxillofacial structures
were performed using the standard protocol without intravenous
contrast. Multiplanar CT image reconstructions of the maxillofacial
structures were also generated.

[Series 2: 1 max soft · axial · 0.33mm/px · z∈[+880,+1014]mm · 10 of 79 slices shown, 13 images]
[im 6/79  brain]
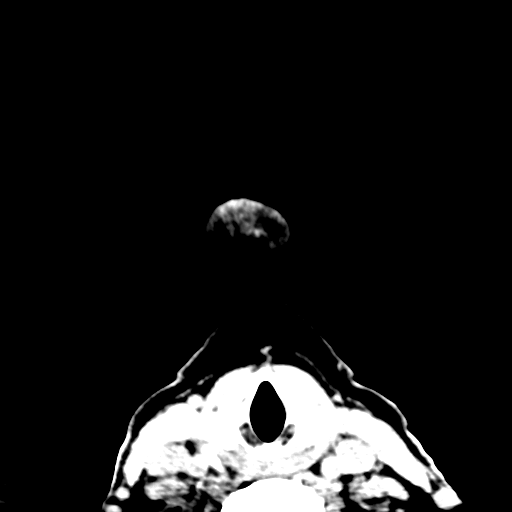
[im 6/79  bone]
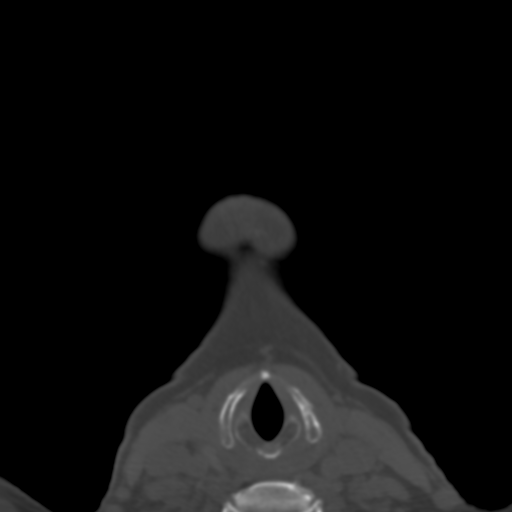
[im 14/79  bone]
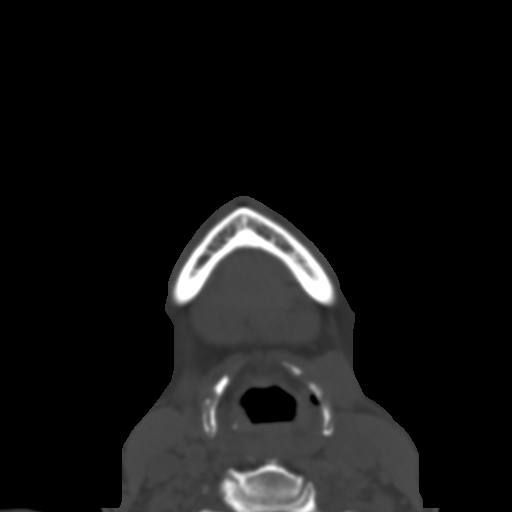
[im 22/79  bone]
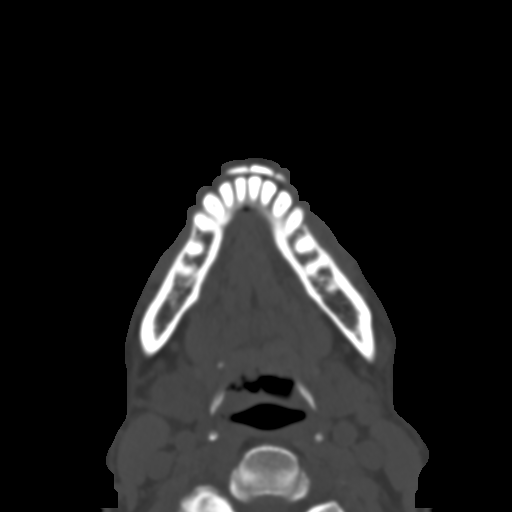
[im 27/79  bone]
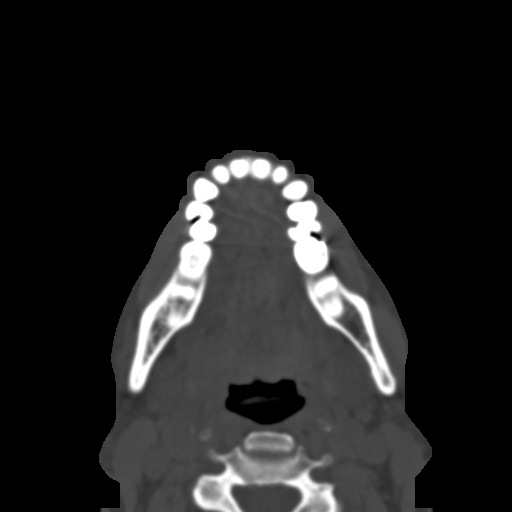
[im 35/79  brain]
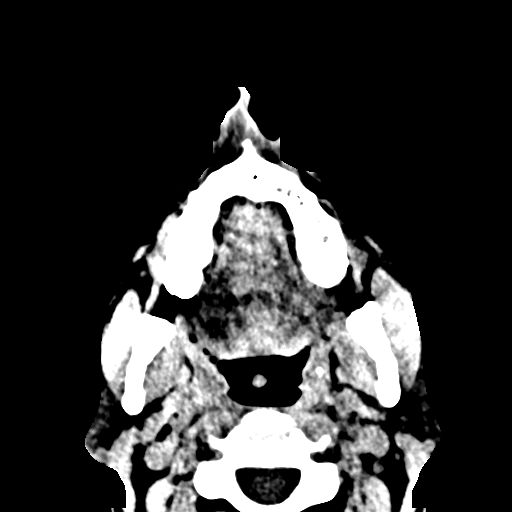
[im 35/79  bone]
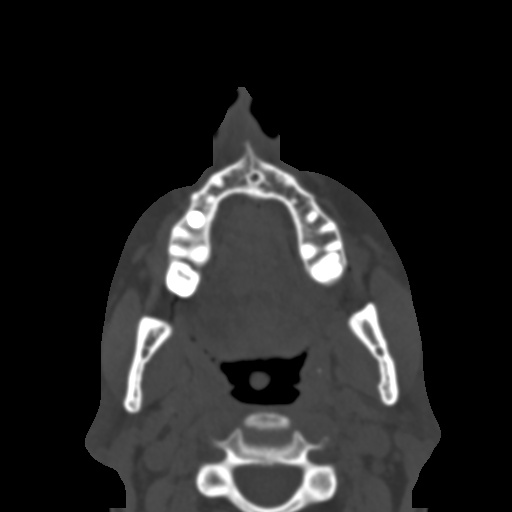
[im 44/79  bone]
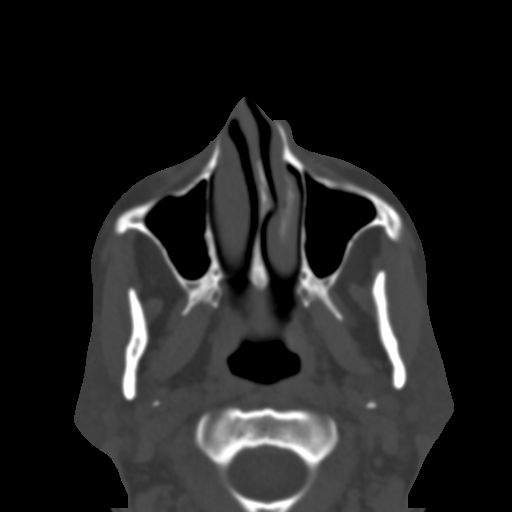
[im 52/79  bone]
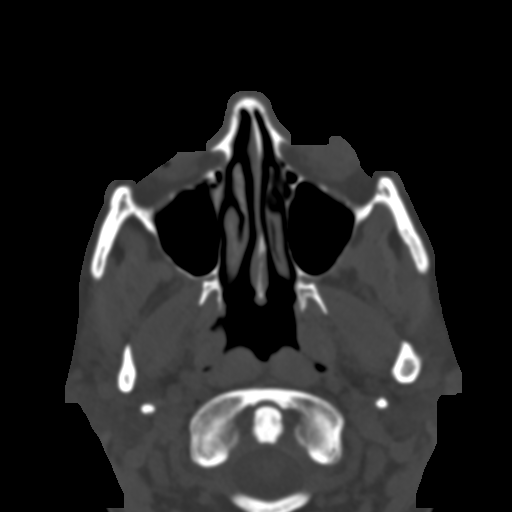
[im 60/79  bone]
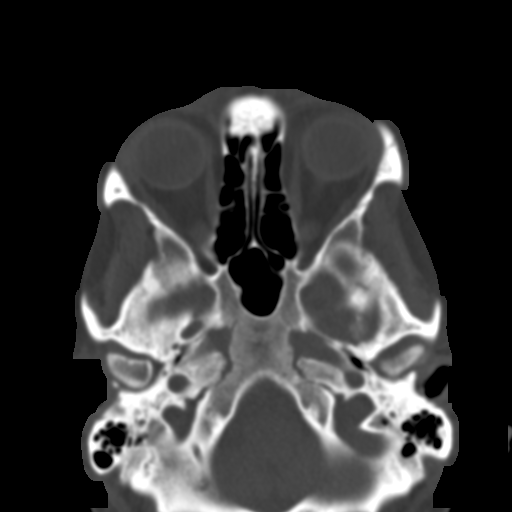
[im 65/79  brain]
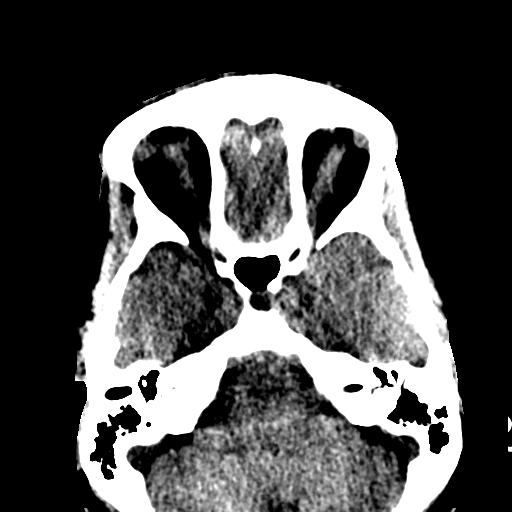
[im 65/79  bone]
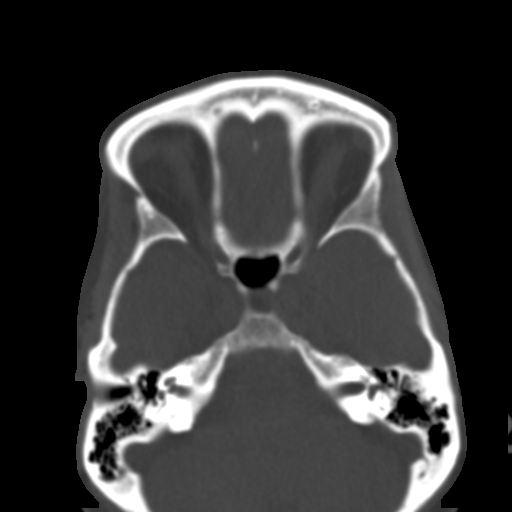
[im 73/79  bone]
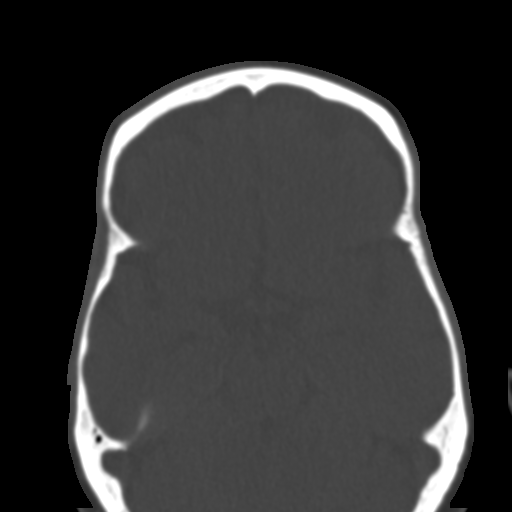

[Series 6: coronal soft · coronal · 0.34mm/px · 3 of 74 slices shown]
[im 25/74  bone]
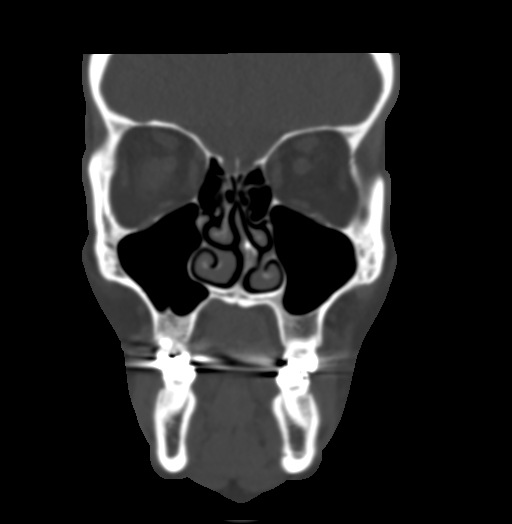
[im 33/74  bone]
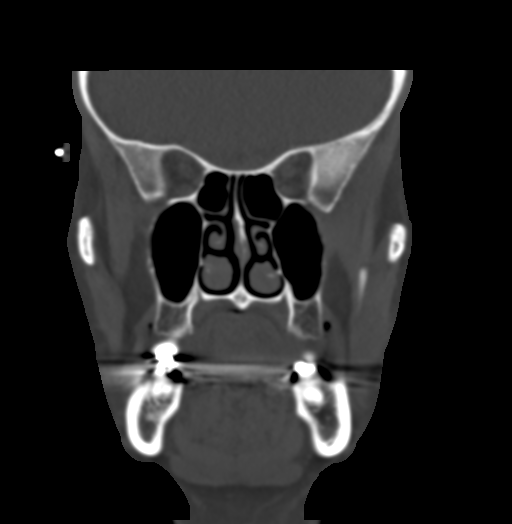
[im 41/74  bone]
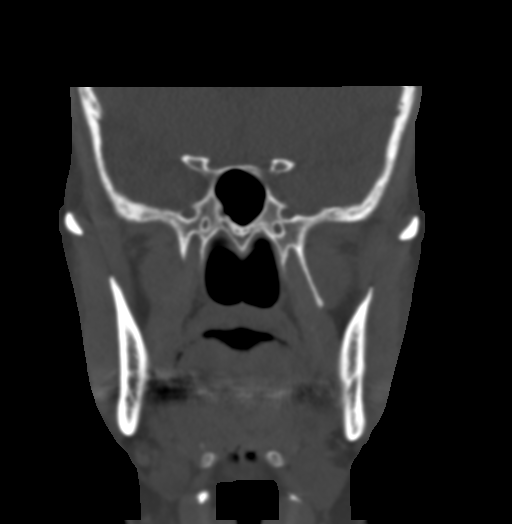

[Series 8: sagittal soft · sagittal · 0.33mm/px · 3 of 74 slices shown]
[im 25/74  bone]
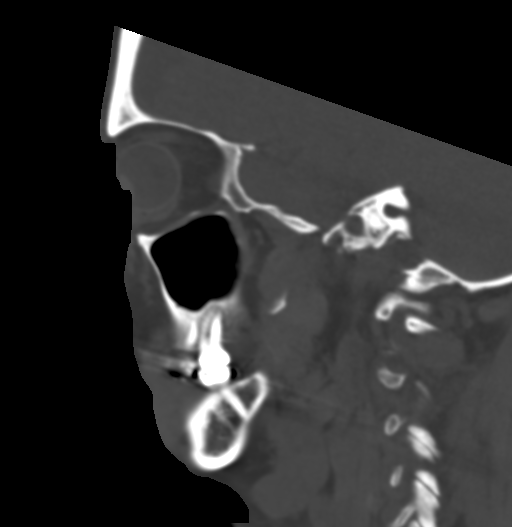
[im 37/74  bone]
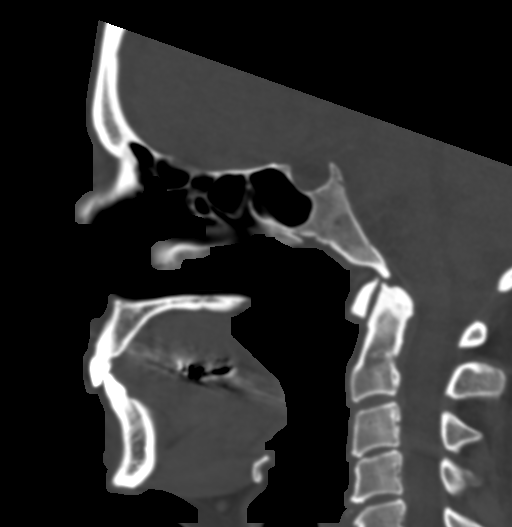
[im 49/74  bone]
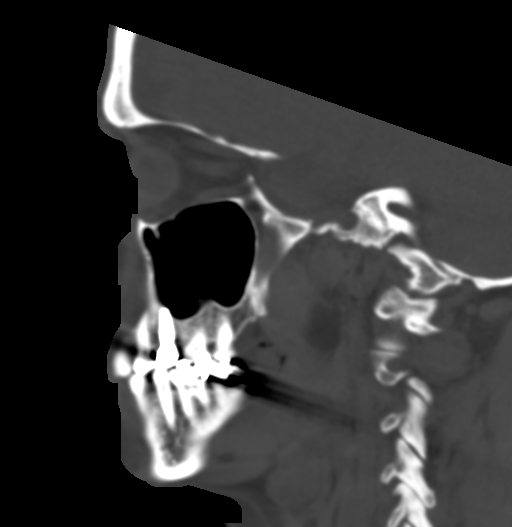

[16 of 47 positions shown; findings below may reference images not displayed]

FINDINGS: CT HEAD FINDINGS

Brain: No evidence of acute infarction, hemorrhage, hydrocephalus,
extra-axial collection or mass lesion/mass effect. Signs of mild
generalized atrophy

Vascular: No hyperdense vessel or unexpected calcification.

Skull: Normal. Negative for fracture or focal lesion.

Other: None aside from minimal soft tissue swelling along the medial
LEFT preseptal soft tissues about the LEFT orbit. See face CT.

CT MAXILLOFACIAL FINDINGS

Osseous: Minimally displaced RIGHT nasal bone fracture, tiny
fracture just off the midline suspected. Uncertain chronicity. No
orbital fracture. No mandibular fracture or other fracture
identified.

Orbits: Mild infraorbital stranding on the LEFT suspected. Small
amount of gas in the subcutaneous soft tissues of the bridge of the
nose likely related to reported laceration in this location.

Sinuses: No air-fluid levels in the sinuses.  No mucosal thickening.

Soft tissues: As above
IMPRESSION: No acute intracranial abnormality.  With mild cerebral atrophy.

Minimally displaced RIGHT nasal bone fracture, tiny fracture just
off the midline suspected. Potential minimally displaced fracture of
the RIGHT nasal bone. Otherwise negative evaluation of the bony
structures of the face.

Mild soft tissue swelling along the medial LEFT preseptal soft
tissues about the LEFT orbit. Mild infraorbital stranding on the
LEFT suspected. Small amount of gas in the subcutaneous soft tissues
of the bridge of the nose likely related to reported laceration in
this location.

## 2023-07-10 IMAGING — CT CT HEAD W/O CM
4 series · 16 of 47 positions shown, 18 images · non-contrast
Comparison: None

CLINICAL DATA: A 65-year-old female presents with maxillofacial
pain, laceration across nose. Also with reported bruising about the
LEFT orbit.

EXAM:
CT HEAD WITHOUT CONTRAST
CT MAXILLOFACIAL WITHOUT CONTRAST
TECHNIQUE: Multidetector CT imaging of the head and maxillofacial structures
were performed using the standard protocol without intravenous
contrast. Multiplanar CT image reconstructions of the maxillofacial
structures were also generated.

[Series 2: head wo · axial · 0.45mm/px · z∈[+989,+1104]mm · 7 of 31 slices shown, 9 images]
[im 4/31  brain]
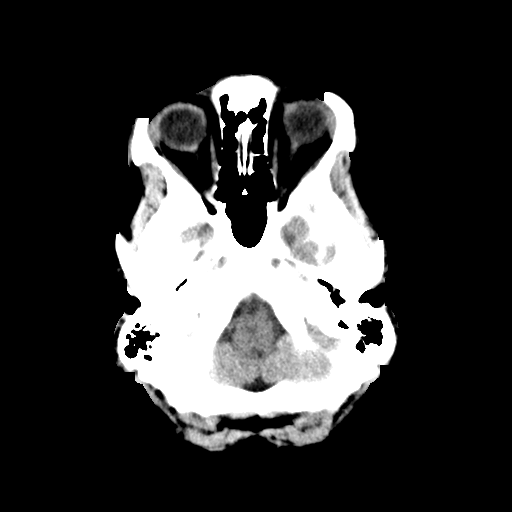
[im 4/31  bone]
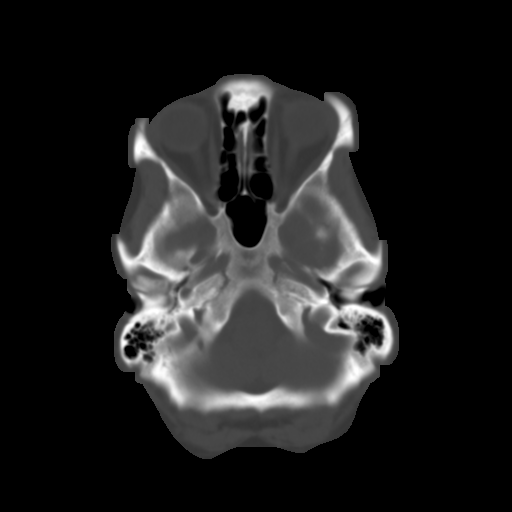
[im 8/31  brain]
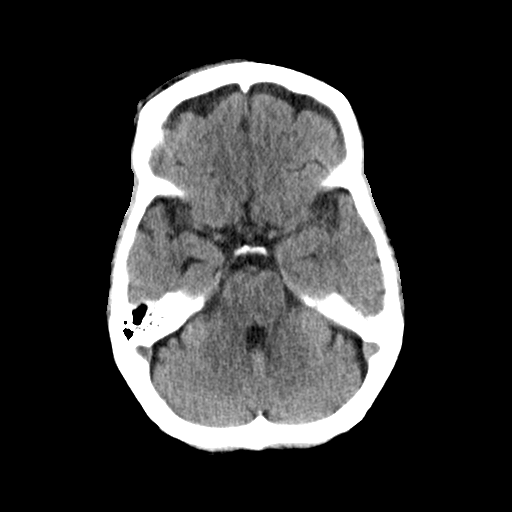
[im 12/31  brain]
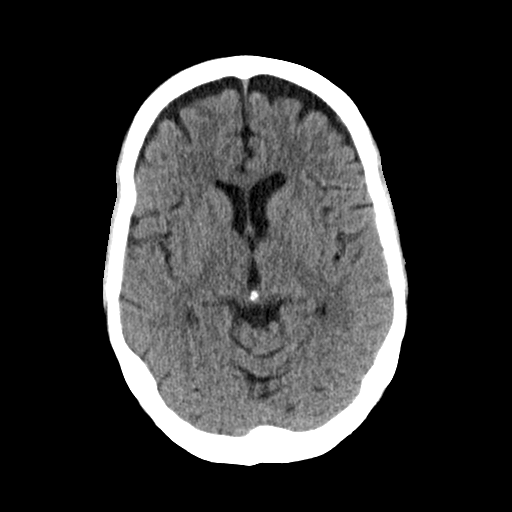
[im 16/31  brain]
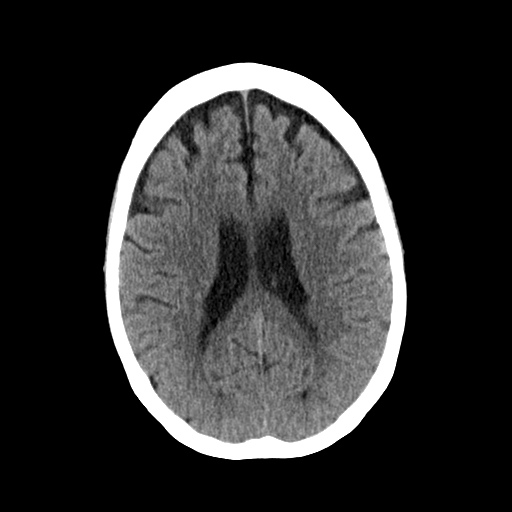
[im 19/31  brain]
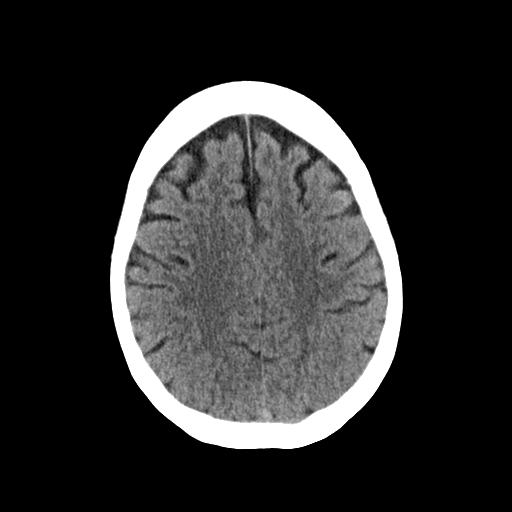
[im 19/31  bone]
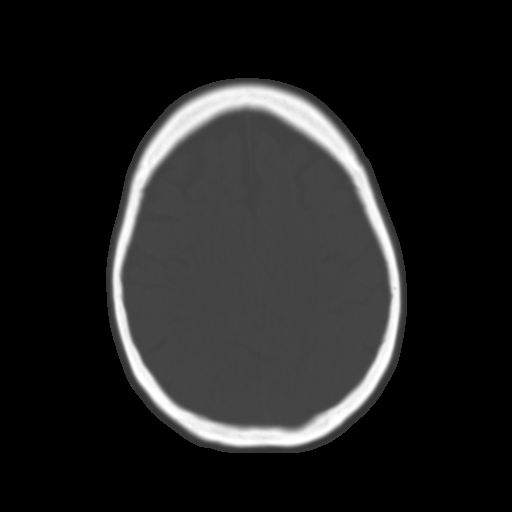
[im 23/31  brain]
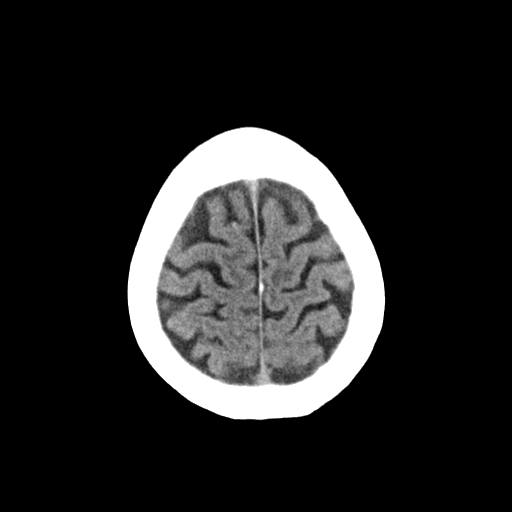
[im 27/31  brain]
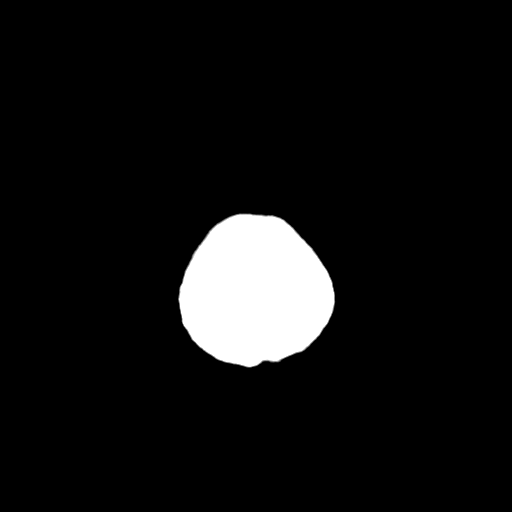

[Series 3: head bone · axial · 0.45mm/px · z∈[+988,+1018]mm · 3 of 77 slices shown]
[im 8/77  bone]
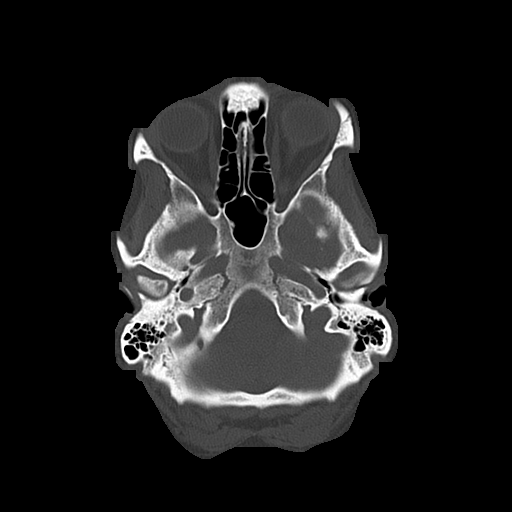
[im 16/77  bone]
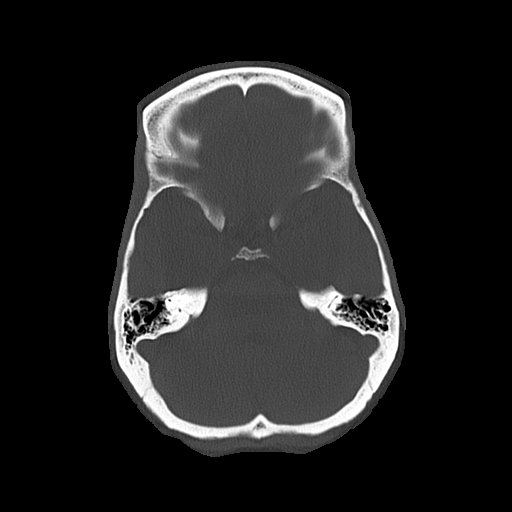
[im 23/77  bone]
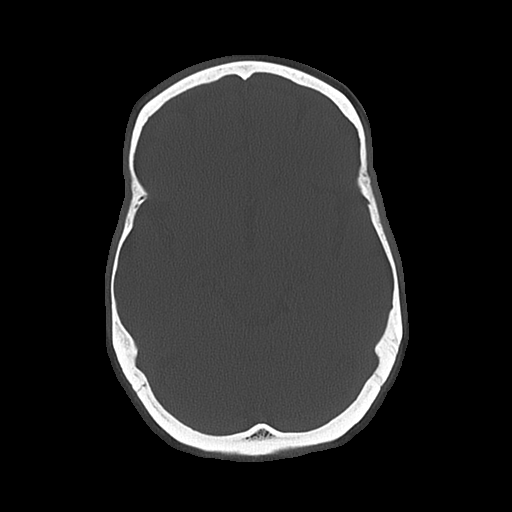

[Series 4: coronal soft · coronal · 0.31mm/px · 3 of 67 slices shown]
[im 23/67  brain]
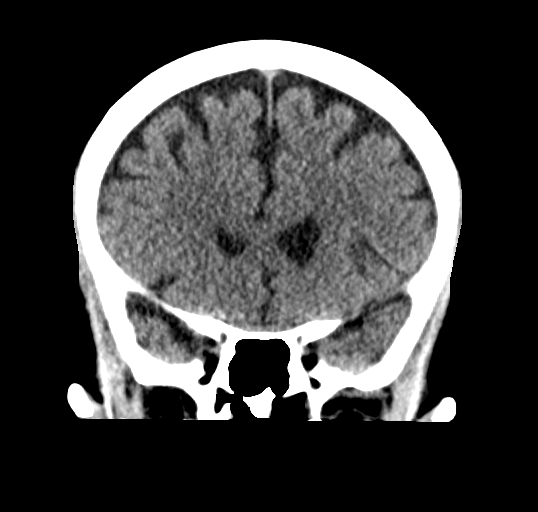
[im 30/67  brain]
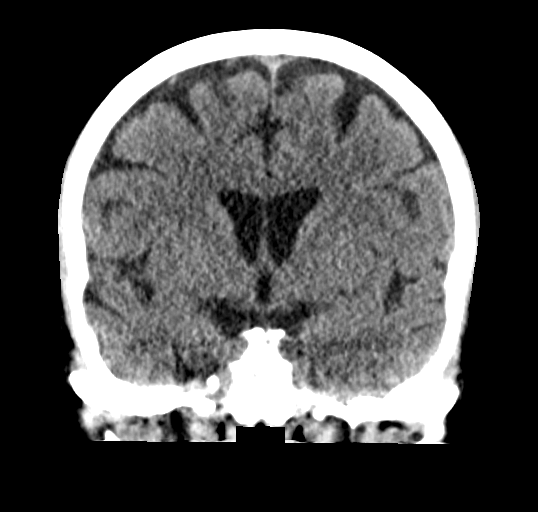
[im 37/67  brain]
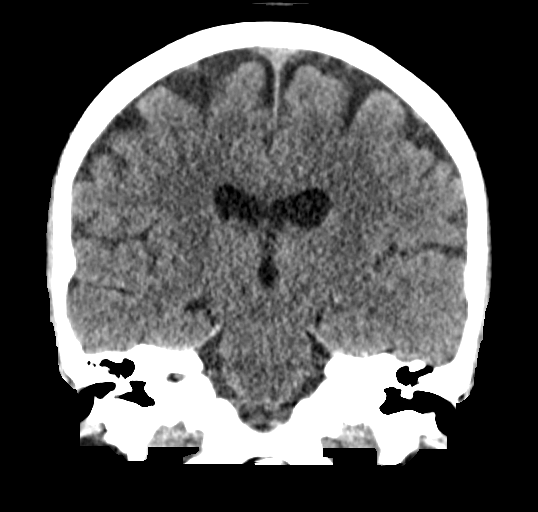

[Series 5: sagittal soft · sagittal · 0.31mm/px · 3 of 52 slices shown]
[im 18/52  brain]
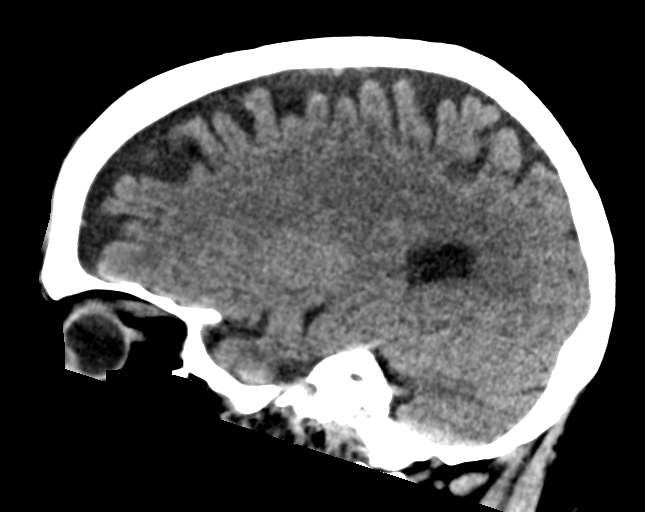
[im 26/52  brain]
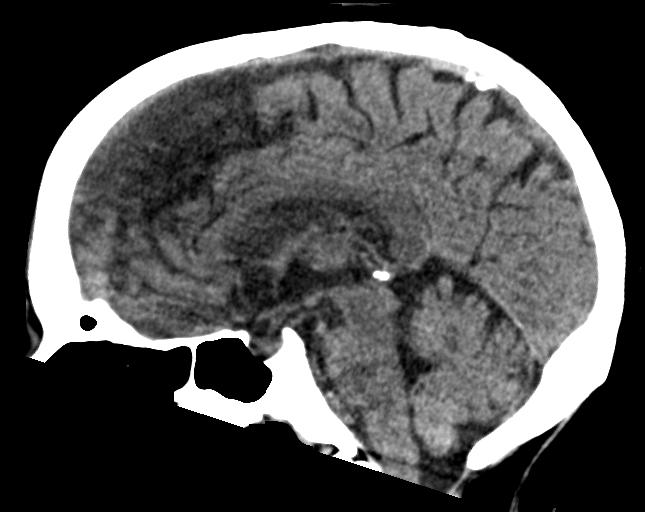
[im 35/52  brain]
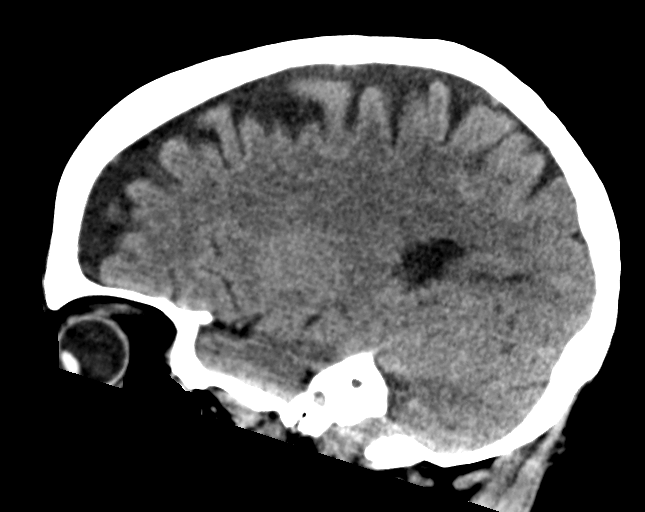

[16 of 47 positions shown; findings below may reference images not displayed]

FINDINGS: CT HEAD FINDINGS

Brain: No evidence of acute infarction, hemorrhage, hydrocephalus,
extra-axial collection or mass lesion/mass effect. Signs of mild
generalized atrophy

Vascular: No hyperdense vessel or unexpected calcification.

Skull: Normal. Negative for fracture or focal lesion.

Other: None aside from minimal soft tissue swelling along the medial
LEFT preseptal soft tissues about the LEFT orbit. See face CT.

CT MAXILLOFACIAL FINDINGS

Osseous: Minimally displaced RIGHT nasal bone fracture, tiny
fracture just off the midline suspected. Uncertain chronicity. No
orbital fracture. No mandibular fracture or other fracture
identified.

Orbits: Mild infraorbital stranding on the LEFT suspected. Small
amount of gas in the subcutaneous soft tissues of the bridge of the
nose likely related to reported laceration in this location.

Sinuses: No air-fluid levels in the sinuses.  No mucosal thickening.

Soft tissues: As above
IMPRESSION: No acute intracranial abnormality.  With mild cerebral atrophy.

Minimally displaced RIGHT nasal bone fracture, tiny fracture just
off the midline suspected. Potential minimally displaced fracture of
the RIGHT nasal bone. Otherwise negative evaluation of the bony
structures of the face.

Mild soft tissue swelling along the medial LEFT preseptal soft
tissues about the LEFT orbit. Mild infraorbital stranding on the
LEFT suspected. Small amount of gas in the subcutaneous soft tissues
of the bridge of the nose likely related to reported laceration in
this location.

## 2023-09-10 ENCOUNTER — Other Ambulatory Visit: Payer: Self-pay | Admitting: Hematology and Oncology

## 2023-09-13 NOTE — Progress Notes (Addendum)
[facility] OFFICE PROGRESS NOTE  Erica Fillers, MD [address] [place] [address]  DIAGNOSIS: Iron deficiency anemia   PRIOR THERAPY: IV monoferric 1000 mg x 1 dose on 01/16/2023   CURRENT THERAPY: Prescription oral iron supplement although she has stopped this for last 4 to 6 weeks due to GI upset.  She is going to try resuming this in the next few days every other day with vitamin C.  INTERVAL HISTORY: Erica Potts 67 y.o. female returns to the clinic today for a 6 month follow up visit. The patient is followed by Dr. Leonides Schanz for IDA. She received IV iron with monoferric, the most recent being on 01/16/23.  She was taking an iron supplement with prescription ferrous sulfate with vitamin C but had some GI upset a few weeks ago with abdominal cramping and she has not resumed taking this at this time.  She is going to consider resuming iron to see if she is able to tolerate every other day with vitamin C.  She has a history of AVMs and is followed by Dr. Marina Goodell from GI.  She takes a PPI.  Overall, her energy is fair.  She denies any shortness of breath, visible bleeding, fevers, chills, night sweats, or unexplained weight loss.  Denies any dizziness or lightheadedness.  The patient has had persistently elevated platelet count since at least 2021.  The patient reports that her mom had a blood disorder, she thinks it may be CLL but she is not certain.  Her mother was in her late 80s when she was diagnosed.  The patient denies any history of blood clots.  She was previously on estrogen which she discontinued in July 2024.  She was on an estrogen patch.  She was on prednisone for the last 5 days due to issues with her back.  She is not taking a baby aspirin due to her prior history of AVMs and black tarry stool but she is concerned about stroke risk and is wondering if she should resume this.   She is here for evaluation and repeat blood work.   MEDICAL HISTORY: Past  Medical History:  Diagnosis Date   Anxiety    Clostridium difficile infection    Colitis    DDD (degenerative disc disease), lumbar    Depression    HLD (hyperlipidemia)    HTN (hypertension)    IDA (iron deficiency anemia)    Insomnia    Melanoma (HCC) 2000's   Ovarian cyst, left    Palpitations    Spinal stenosis     ALLERGIES:  has No Known Allergies.  MEDICATIONS:  Current Outpatient Medications  Medication Sig Dispense Refill   ALPRAZolam (XANAX) 0.5 MG tablet Take 0.5 mg by mouth at bedtime as needed for sleep. Take 1/2 -1 tab at bedtime as needed     buPROPion (WELLBUTRIN SR) 150 MG 12 hr tablet Take 150 mg by mouth every morning.     buPROPion (WELLBUTRIN) 75 MG tablet Take 75 mg by mouth every morning.     cyclobenzaprine (FLEXERIL) 10 MG tablet Take 10 mg by mouth at bedtime.     docusate sodium (COLACE) 100 MG capsule Take 100 mg by mouth 2 (two) times daily.     DULoxetine (CYMBALTA) 30 MG capsule Take 30 mg by mouth daily.     ferrous sulfate 325 (65 FE) MG EC tablet TAKE 1 TABLET BY MOUTH EVERY DAY WITH BREAKFAST 90 tablet 1   gabapentin (NEURONTIN)  300 MG capsule Take 600 mg by mouth at bedtime. Take two capsules by mouth at bedtime as needed     HYDROcodone-acetaminophen (NORCO) 7.5-325 MG per tablet Take 2 tablets by mouth 3 (three) times daily as needed for pain. Patient only taking 2 tablets three times daily     hydrocortisone 2.5 % ointment Apply 1 g topically 3 (three) times daily.     lisinopril (PRINIVIL,ZESTRIL) 10 MG tablet Take 10 mg by mouth daily.     meloxicam (MOBIC) 15 MG tablet Take 15 mg by mouth daily.     Multiple Vitamin (MULTIVITAMIN) tablet Take 1 tablet by mouth daily.     Omega-3 Fatty Acids (FISH OIL) 1000 MG CAPS Take 2 capsules by mouth daily.     pantoprazole (PROTONIX) 40 MG tablet Take 1 tablet (40 mg total) by mouth 2 (two) times daily. 180 tablet 3   saccharomyces boulardii (FLORASTOR) 250 MG capsule Take 500 mg by mouth daily.       tretinoin (RETIN-A) 0.025 % cream tretinoin 0.025 % topical cream  APPLY TO AFFECTED AREA EVERY DAY AT NIGHT     VITAMIN D PO Take 2,000 Units by mouth daily.      zolpidem (AMBIEN) 10 MG tablet Take 10 mg by mouth at bedtime as needed for sleep.     No current facility-administered medications for this visit.    SURGICAL HISTORY:  Past Surgical History:  Procedure Laterality Date   BREAST SURGERY     benign nodule   CESAREAN SECTION  1983, 1986   x 2   COLONOSCOPY     DILATION AND CURETTAGE OF UTERUS  2012   ESOPHAGOGASTRODUODENOSCOPY (EGD) WITH PROPOFOL N/A 08/17/2021   Procedure: ESOPHAGOGASTRODUODENOSCOPY (EGD) WITH PROPOFOL;  Surgeon: Hilarie Fredrickson, MD;  Location: [facility];  Service: Endoscopy;  Laterality: N/A;   LAPAROSCOPIC TOTAL HYSTERECTOMY  2013   heavy bleeding   MELANOMA EXCISION     MENISCUS REPAIR Left 2011   MOUTH SURGERY Right    maxilary   nipple biopsy     OVARIAN CYST REMOVAL  1990   POLYPECTOMY     SHOULDER ARTHROSCOPY DISTAL CLAVICLE EXCISION AND OPEN ROTATOR CUFF REPAIR     Aug 12 2022   TUBAL LIGATION     UPPER GASTROINTESTINAL ENDOSCOPY      REVIEW OF SYSTEMS:   Review of Systems  Constitutional: Stable mild fatigue (overall has good energy). Negative for appetite change, chills,  fever and unexpected weight change.  HENT: Negative for mouth sores, nosebleeds, sore throat and trouble swallowing.   Eyes: Negative for eye problems and icterus.  Respiratory: Negative for cough, hemoptysis, shortness of breath and wheezing.   Cardiovascular: Negative for chest pain and leg swelling.  Gastrointestinal: Negative for abdominal pain, constipation, diarrhea, nausea and vomiting.  Genitourinary: Negative for bladder incontinence, difficulty urinating, dysuria, frequency and hematuria.   Musculoskeletal: Negative for back pain, gait problem, neck pain and neck stiffness.  Skin: Negative for itching and rash.  Neurological: Negative for dizziness,  extremity weakness, gait problem, headaches, light-headedness and seizures.  Hematological: Negative for adenopathy. Does not bruise/bleed easily.  Psychiatric/Behavioral: Negative for confusion, depression and sleep disturbance. The patient is not nervous/anxious.     PHYSICAL EXAMINATION:  There were no vitals taken for this visit.  ECOG PERFORMANCE STATUS: 0  Physical Exam  Constitutional: Oriented to person, place, and time and well-developed, well-nourished, and in no distress.  HENT:  Head: Normocephalic and atraumatic.  Mouth/Throat: Oropharynx is  clear and moist. No oropharyngeal exudate.  Eyes: Conjunctivae are normal. Right eye exhibits no discharge. Left eye exhibits no discharge. No scleral icterus.  Neck: Normal range of motion. Neck supple.  Cardiovascular: Normal rate, regular rhythm, normal heart sounds and intact distal pulses.   Pulmonary/Chest: Effort normal and breath sounds normal. No respiratory distress. No wheezes. No rales.  Abdominal: Soft. Bowel sounds are normal. Exhibits no distension and no mass. There is no tenderness.  Musculoskeletal: Normal range of motion. Exhibits no edema.  Lymphadenopathy:    No cervical adenopathy.  Neurological: Alert and oriented to person, place, and time. Exhibits normal muscle tone. Gait normal. Coordination normal.  Skin: Skin is warm and dry. No rash noted. Not diaphoretic. No erythema. No pallor.  Psychiatric: Mood, memory and judgment normal.  Vitals reviewed.  LABORATORY DATA: Lab Results  Component Value Date   WBC 8.8 03/17/2023   HGB 13.6 03/17/2023   HCT 40.7 03/17/2023   MCV 91.9 03/17/2023   PLT 533 (H) 03/17/2023      Chemistry      Component Value Date/Time   NA 136 12/09/2022 1040   K 4.2 12/09/2022 1040   CL 103 12/09/2022 1040   CO2 29 12/09/2022 1040   BUN 11 12/09/2022 1040   CREATININE 0.89 12/09/2022 1040      Component Value Date/Time   CALCIUM 9.1 12/09/2022 1040   ALKPHOS 61  12/09/2022 1040   AST 20 12/09/2022 1040   ALT 14 12/09/2022 1040   BILITOT 0.3 12/09/2022 1040       RADIOGRAPHIC STUDIES:  No results found.   ASSESSMENT/PLAN:  Erica Potts is a 67 y.o. female returns to the clinic for follow up of iron deficiency anemia.    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 --Labs today show no anemia with Hgb of 13.1, MCV 93.4. Her iron panel resulted after leaving the clinic which showed. Iron panel shows no deficiency with serum iron 99, saturation 28%,ferritin pending. Her platelet count continues to be elevated at 565k and WBC slightly elevated at 11.0 (recent prednisone use?) --Likely does not need IV iron at this time. We discussed alternatives if her prescription iron causes GI upset such as slow release iron, lower iron content iron, liquid iron, prenatal iron. Of course, if she has intolerance to these, then she may discontinue. We talked about taking every other day for tolerability.  --No need for additional IV iron at this time. --Addendum: her ferritin is 18 which I suspect is from stopping her iron a few weeks ago, I will let her know to resume taking iron if possible then rechecking in 3 months with provider visit to make sure improving. If she is not able to tolerate.    #Thrombocytosis: --Felt to be likely secondary to iron deficiency, though MPN could be a consideration.  The patient was on estrogen patch for several years but discontinued this in July 2024. She is on steroids periodically for back pain, she recently completed 5 day course yesterday --Platelet count is 565K today.  --I reviewed with Dr. Leonides Schanz, if her iron studies are normal, then I will call her back for lab only visit Monday or Friday next week for JAK 2 testing and BCR/ABL. Her mother had blood disorder, she believes it may have been CLL.  -The patient is concerned about stroke risk. She does have  history of AVMs. We discussed pros and cons  of 81 mg aspirin. If she feels more comfortable, she may consider resuming 81 mg aspirin but this could cause GI bleeding.  -If she gets testing next week for MPN, then if abnormal, we will call her back sooner to be evaluated.  --Addendum: Iron panel normal but ferritin 18.However, her ferritin was 147 at her last visit and she still had elevated platelet count. We will move forward with MPN testing. I called her and left voicemail with this information and will send my chart message.      No orders of the defined types were placed in this encounter.    The total time spent in the appointment was 20-29 minutes  Erica Spreen L Jonnae Fonseca, PA-C 09/13/23

## 2023-09-15 ENCOUNTER — Inpatient Hospital Stay: Payer: Medicare Other | Attending: Physician Assistant

## 2023-09-15 ENCOUNTER — Inpatient Hospital Stay (HOSPITAL_BASED_OUTPATIENT_CLINIC_OR_DEPARTMENT_OTHER): Payer: Medicare Other | Admitting: Physician Assistant

## 2023-09-15 ENCOUNTER — Other Ambulatory Visit: Payer: Self-pay | Admitting: Physician Assistant

## 2023-09-15 ENCOUNTER — Encounter: Payer: Self-pay | Admitting: Physician Assistant

## 2023-09-15 VITALS — BP 109/59 | HR 103 | Temp 98.0°F | Resp 16 | Wt 174.4 lb

## 2023-09-15 DIAGNOSIS — Z79899 Other long term (current) drug therapy: Secondary | ICD-10-CM | POA: Insufficient documentation

## 2023-09-15 DIAGNOSIS — D508 Other iron deficiency anemias: Secondary | ICD-10-CM | POA: Diagnosis not present

## 2023-09-15 DIAGNOSIS — Q2739 Arteriovenous malformation, other site: Secondary | ICD-10-CM | POA: Diagnosis present

## 2023-09-15 DIAGNOSIS — D75839 Thrombocytosis, unspecified: Secondary | ICD-10-CM | POA: Insufficient documentation

## 2023-09-15 DIAGNOSIS — D509 Iron deficiency anemia, unspecified: Secondary | ICD-10-CM | POA: Insufficient documentation

## 2023-09-15 DIAGNOSIS — Q2730 Arteriovenous malformation, site unspecified: Secondary | ICD-10-CM

## 2023-09-15 LAB — IRON AND IRON BINDING CAPACITY (CC-WL,HP ONLY)
Iron: 99 ug/dL (ref 28–170)
Saturation Ratios: 28 % (ref 10.4–31.8)
TIBC: 358 ug/dL (ref 250–450)
UIBC: 259 ug/dL (ref 148–442)

## 2023-09-15 LAB — CBC WITH DIFFERENTIAL (CANCER CENTER ONLY)
Abs Immature Granulocytes: 0.04 10*3/uL (ref 0.00–0.07)
Basophils Absolute: 0.1 10*3/uL (ref 0.0–0.1)
Basophils Relative: 1 %
Eosinophils Absolute: 0.1 10*3/uL (ref 0.0–0.5)
Eosinophils Relative: 1 %
HCT: 39.7 % (ref 36.0–46.0)
Hemoglobin: 13.1 g/dL (ref 12.0–15.0)
Immature Granulocytes: 0 %
Lymphocytes Relative: 34 %
Lymphs Abs: 3.8 10*3/uL (ref 0.7–4.0)
MCH: 30.8 pg (ref 26.0–34.0)
MCHC: 33 g/dL (ref 30.0–36.0)
MCV: 93.4 fL (ref 80.0–100.0)
Monocytes Absolute: 1 10*3/uL (ref 0.1–1.0)
Monocytes Relative: 9 %
Neutro Abs: 6.1 10*3/uL (ref 1.7–7.7)
Neutrophils Relative %: 55 %
Platelet Count: 565 10*3/uL — ABNORMAL HIGH (ref 150–400)
RBC: 4.25 MIL/uL (ref 3.87–5.11)
RDW: 14.5 % (ref 11.5–15.5)
WBC Count: 11 10*3/uL — ABNORMAL HIGH (ref 4.0–10.5)
nRBC: 0 % (ref 0.0–0.2)

## 2023-09-15 LAB — FERRITIN: Ferritin: 18 ng/mL (ref 11–307)

## 2023-09-16 ENCOUNTER — Telehealth: Payer: Self-pay | Admitting: Hematology and Oncology

## 2023-09-16 NOTE — Telephone Encounter (Signed)
Scheduled per 11/08 scheduling message, patient has been called and notified.

## 2023-09-18 ENCOUNTER — Telehealth: Payer: Self-pay | Admitting: Hematology and Oncology

## 2023-09-29 ENCOUNTER — Inpatient Hospital Stay: Payer: Medicare Other

## 2023-09-29 DIAGNOSIS — D75839 Thrombocytosis, unspecified: Secondary | ICD-10-CM

## 2023-10-09 LAB — JAK2 (INCLUDING V617F AND EXON 12), MPL,& CALR W/RFL MPN PANEL (NGS)

## 2023-10-09 LAB — BCR ABL1 FISH (GENPATH)

## 2023-10-13 ENCOUNTER — Other Ambulatory Visit: Payer: Self-pay | Admitting: Internal Medicine

## 2023-10-16 ENCOUNTER — Telehealth: Payer: Self-pay | Admitting: Physician Assistant

## 2023-10-16 NOTE — Telephone Encounter (Signed)
I called the patient to discuss her JAK2 testing. Unable to reach. Left voicemail. Her labs showed tier 2 mutation. Reviewed with Dr. Leonides Schanz who stated "This can help explain why the blood counts are high, but doesn't require any treatment or intervention. Normally for these patients we recommend observation, typically every 6 - 12 months (start with q 6 months)". Left a voicemail that we will continue to monitor labs but no change in management. If she would like to discuss this further, I left our call back number.

## 2023-10-19 ENCOUNTER — Telehealth: Payer: Self-pay | Admitting: Physician Assistant

## 2023-10-19 NOTE — Telephone Encounter (Signed)
Called the patient to discuss her JAK2 testing. Unable to reach her. Left a voicemail that I was calling to see if she received my prior message and to make sure she did not have any further questions. I left our call back number if she would like to discuss this or has questions.

## 2023-11-15 ENCOUNTER — Telehealth: Payer: Self-pay

## 2023-11-15 NOTE — Telephone Encounter (Signed)
 Attempted to reach patient in regards to multiple attempts from [place], [place] about JAK2 results. Asked for a return call.

## 2023-11-16 NOTE — Telephone Encounter (Signed)
 Pt notified that , Dr. Federico said that she has a-  Tier 2 mutation. This can help explain why the blood counts are high, but doesn't require any treatment or intervention. Normally for these patients we recommend observation, typically every 6 - 12 months (start with q 6 months). I confirmed her feb appts.

## 2023-11-17 ENCOUNTER — Encounter: Payer: Self-pay | Admitting: Hematology and Oncology

## 2023-11-28 ENCOUNTER — Other Ambulatory Visit: Payer: Self-pay | Admitting: Obstetrics and Gynecology

## 2023-11-28 DIAGNOSIS — N6310 Unspecified lump in the right breast, unspecified quadrant: Secondary | ICD-10-CM

## 2023-12-15 ENCOUNTER — Inpatient Hospital Stay: Payer: Medicare Other | Attending: Physician Assistant

## 2023-12-15 ENCOUNTER — Inpatient Hospital Stay (HOSPITAL_BASED_OUTPATIENT_CLINIC_OR_DEPARTMENT_OTHER): Payer: Medicare Other | Admitting: Hematology and Oncology

## 2023-12-15 VITALS — BP 147/81 | HR 105 | Temp 97.9°F | Resp 16 | Wt 170.4 lb

## 2023-12-15 DIAGNOSIS — D75839 Thrombocytosis, unspecified: Secondary | ICD-10-CM | POA: Diagnosis not present

## 2023-12-15 DIAGNOSIS — D509 Iron deficiency anemia, unspecified: Secondary | ICD-10-CM | POA: Insufficient documentation

## 2023-12-15 DIAGNOSIS — Q2739 Arteriovenous malformation, other site: Secondary | ICD-10-CM | POA: Insufficient documentation

## 2023-12-15 DIAGNOSIS — Z79899 Other long term (current) drug therapy: Secondary | ICD-10-CM | POA: Insufficient documentation

## 2023-12-15 DIAGNOSIS — Z87891 Personal history of nicotine dependence: Secondary | ICD-10-CM | POA: Insufficient documentation

## 2023-12-15 DIAGNOSIS — Z7982 Long term (current) use of aspirin: Secondary | ICD-10-CM | POA: Insufficient documentation

## 2023-12-15 DIAGNOSIS — D508 Other iron deficiency anemias: Secondary | ICD-10-CM

## 2023-12-15 DIAGNOSIS — Z8042 Family history of malignant neoplasm of prostate: Secondary | ICD-10-CM | POA: Insufficient documentation

## 2023-12-15 DIAGNOSIS — Z801 Family history of malignant neoplasm of trachea, bronchus and lung: Secondary | ICD-10-CM | POA: Diagnosis not present

## 2023-12-15 LAB — CBC WITH DIFFERENTIAL (CANCER CENTER ONLY)
Abs Immature Granulocytes: 0.02 10*3/uL (ref 0.00–0.07)
Basophils Absolute: 0.1 10*3/uL (ref 0.0–0.1)
Basophils Relative: 1 %
Eosinophils Absolute: 0.1 10*3/uL (ref 0.0–0.5)
Eosinophils Relative: 2 %
HCT: 42.5 % (ref 36.0–46.0)
Hemoglobin: 13.8 g/dL (ref 12.0–15.0)
Immature Granulocytes: 0 %
Lymphocytes Relative: 22 %
Lymphs Abs: 2 10*3/uL (ref 0.7–4.0)
MCH: 30.2 pg (ref 26.0–34.0)
MCHC: 32.5 g/dL (ref 30.0–36.0)
MCV: 93 fL (ref 80.0–100.0)
Monocytes Absolute: 0.7 10*3/uL (ref 0.1–1.0)
Monocytes Relative: 8 %
Neutro Abs: 6 10*3/uL (ref 1.7–7.7)
Neutrophils Relative %: 67 %
Platelet Count: 482 10*3/uL — ABNORMAL HIGH (ref 150–400)
RBC: 4.57 MIL/uL (ref 3.87–5.11)
RDW: 14.5 % (ref 11.5–15.5)
WBC Count: 9 10*3/uL (ref 4.0–10.5)
nRBC: 0 % (ref 0.0–0.2)

## 2023-12-15 LAB — IRON AND IRON BINDING CAPACITY (CC-WL,HP ONLY)
Iron: 35 ug/dL (ref 28–170)
Saturation Ratios: 9 % — ABNORMAL LOW (ref 10.4–31.8)
TIBC: 412 ug/dL (ref 250–450)
UIBC: 377 ug/dL (ref 148–442)

## 2023-12-15 LAB — FERRITIN: Ferritin: 32 ng/mL (ref 11–307)

## 2023-12-15 NOTE — Progress Notes (Signed)
 [facility] [phone]   [phone]  PROGRESS NOTE  Patient Care Team: Yolande Toribio MATSU, MD as PCP - General (Internal Medicine)  CHIEF COMPLAINTS/PURPOSE OF CONSULTATION:  Iron deficiency anemia  HISTORY OF PRESENTING ILLNESS:  Erica Potts 68 y.o. female returns for a follow up for iron deficiency anemia. She is unaccompanied for this visit. She was last seen 09/15/2023.    Erica Potts reports she has felt well overall interim since our last visit.  She continues taking her p.o. iron almost every day.  She reports that she has overall good energy and reports is about an 8 or 9 out of 10, but she feels like overall her energy levels are down.  She reports does have some occasional dark tarry stools but this is not often.  She reports that she does eat red meat and eats that approximately 2-3 times per week.  She reports her sleep has been good but she is trying to get off her Ambien medication.  She denies any overt signs of bleeding such as nosebleeds, gum bleeding, or dark stools.  She reports that she has had no issues with headache, vision changes, lightheadedness, or dizziness.  She has had no recent infectious symptoms such as runny nose, sore throat, or cough.  She is also taking baby aspirin 81 mg p.o. daily.  Otherwise she denies any fevers, chills, sweats, nausea, vomiting or diarrhea.  A full 10 point ROS is otherwise negative.  She has no other complaints, questions or concerns.. Rest of the 10 point ROS is below.   MEDICAL HISTORY:  Past Medical History:  Diagnosis Date   Anxiety    Clostridium difficile infection    Colitis    DDD (degenerative disc disease), lumbar    Depression    HLD (hyperlipidemia)    HTN (hypertension)    IDA (iron deficiency anemia)    Insomnia    Melanoma (HCC) 2000's   Ovarian cyst, left    Palpitations    Spinal stenosis     SURGICAL HISTORY: Past Surgical History:  Procedure Laterality  Date   BREAST SURGERY     benign nodule   CESAREAN SECTION  1983, 1986   x 2   COLONOSCOPY     DILATION AND CURETTAGE OF UTERUS  2012   ESOPHAGOGASTRODUODENOSCOPY (EGD) WITH PROPOFOL  N/A 08/17/2021   Procedure: ESOPHAGOGASTRODUODENOSCOPY (EGD) WITH PROPOFOL ;  Surgeon: Abran Norleen SAILOR, MD;  Location: [facility];  Service: Endoscopy;  Laterality: N/A;   LAPAROSCOPIC TOTAL HYSTERECTOMY  2013   heavy bleeding   MELANOMA EXCISION     MENISCUS REPAIR Left 2011   MOUTH SURGERY Right    maxilary   nipple biopsy     OVARIAN CYST REMOVAL  1990   POLYPECTOMY     SHOULDER ARTHROSCOPY DISTAL CLAVICLE EXCISION AND OPEN ROTATOR CUFF REPAIR     Aug 12 2022   TUBAL LIGATION     UPPER GASTROINTESTINAL ENDOSCOPY      SOCIAL HISTORY: Social History   Socioeconomic History   Marital status: Divorced    Spouse name: Not on file   Number of children: 2   Years of education: Not on file   Highest education level: Not on file  Occupational History   Occupation: RN    Comment: [employer].  Tobacco Use   Smoking status: Former    Current packs/day: 0.00    Types: Cigarettes    Quit date: 11/17/1991    Years since  quitting: 32.0   Smokeless tobacco: Never  Vaping Use   Vaping status: Never Used  Substance and Sexual Activity   Alcohol  use: Yes    Alcohol /week: 7.0 standard drinks of alcohol     Types: 7 Standard drinks or equivalent per week    Comment: daily   Drug use: No   Sexual activity: Not on file  Other Topics Concern   Not on file  Social History Narrative   Not on file   Social Drivers of Health   Financial Resource Strain: Not on file  Food Insecurity: Not on file  Transportation Needs: Not on file  Physical Activity: Not on file  Stress: Not on file  Social Connections: Not on file  Intimate Partner Violence: Not on file    FAMILY HISTORY: Family History  Problem Relation Age of Onset   Heart attack Mother        possible blood disorder/cancer   Prostate  cancer Father    Colon polyps Father 37   Dementia Father    Esophageal cancer Paternal Grandfather    Lung cancer Paternal Grandfather    Colon cancer Neg Hx    Rectal cancer Neg Hx    Stomach cancer Neg Hx    Pancreatic cancer Neg Hx    Liver disease Neg Hx     ALLERGIES:  has no known allergies.  MEDICATIONS:  Current Outpatient Medications  Medication Sig Dispense Refill   ALPRAZolam (XANAX) 0.5 MG tablet Take 0.5 mg by mouth at bedtime as needed for sleep. Take 1/2 -1 tab at bedtime as needed     Ascorbic Acid (VITAMIN C) 100 MG tablet Take 100 mg by mouth daily.     buPROPion (WELLBUTRIN SR) 150 MG 12 hr tablet Take 150 mg by mouth every morning.     buPROPion (WELLBUTRIN) 75 MG tablet Take 75 mg by mouth every morning.     celecoxib (CELEBREX) 100 MG capsule Take 100 mg by mouth 2 (two) times daily.     cyclobenzaprine (FLEXERIL) 10 MG tablet Take 10 mg by mouth at bedtime.     docusate sodium (COLACE) 100 MG capsule Take 100 mg by mouth 2 (two) times daily.     DULoxetine (CYMBALTA) 30 MG capsule Take 30 mg by mouth daily.     ferrous sulfate  325 (65 FE) MG EC tablet TAKE 1 TABLET BY MOUTH EVERY DAY WITH BREAKFAST 90 tablet 1   gabapentin (NEURONTIN) 300 MG capsule Take 600 mg by mouth at bedtime. Take two capsules by mouth at bedtime as needed     HYDROcodone-acetaminophen (NORCO) 7.5-325 MG per tablet Take 2 tablets by mouth 3 (three) times daily as needed for pain. Patient only taking 2 tablets three times daily     hydrocortisone 2.5 % ointment Apply 1 g topically 3 (three) times daily.     lisinopril (PRINIVIL,ZESTRIL) 10 MG tablet Take 10 mg by mouth daily.     meloxicam (MOBIC) 15 MG tablet Take 15 mg by mouth daily.     Multiple Vitamin (MULTIVITAMIN) tablet Take 1 tablet by mouth daily.     Omega-3 Fatty Acids (FISH OIL) 1000 MG CAPS Take 2 capsules by mouth daily.     pantoprazole  (PROTONIX ) 40 MG tablet TAKE 1 TABLET BY MOUTH TWICE A DAY 180 tablet 1    saccharomyces boulardii (FLORASTOR) 250 MG capsule Take 500 mg by mouth daily.      tretinoin (RETIN-A) 0.025 % cream tretinoin 0.025 % topical cream  APPLY  TO AFFECTED AREA EVERY DAY AT NIGHT     VITAMIN D PO Take 2,000 Units by mouth daily.      zolpidem (AMBIEN) 10 MG tablet Take 10 mg by mouth at bedtime as needed for sleep.     No current facility-administered medications for this visit.    REVIEW OF SYSTEMS:   Constitutional: ( - ) fevers, ( - )  chills , ( - ) night sweats Eyes: ( - ) blurriness of vision, ( - ) double vision, ( - ) watery eyes Ears, nose, mouth, throat, and face: ( - ) mucositis, ( - ) sore throat Respiratory: ( - ) cough, ( - ) dyspnea, ( - ) wheezes Cardiovascular: ( - ) palpitation, ( - ) chest discomfort, ( - ) lower extremity swelling Gastrointestinal:  ( - ) nausea, ( - ) heartburn, ( -) change in bowel habits Skin: ( - ) abnormal skin rashes Lymphatics: ( - ) new lymphadenopathy, ( - ) easy bruising Neurological: ( - ) numbness, ( - ) tingling, ( - ) new weaknesses Behavioral/Psych: ( - ) mood change, ( - ) new changes  All other systems were reviewed with the patient and are negative.  PHYSICAL EXAMINATION: ECOG PERFORMANCE STATUS: 1 - Symptomatic but completely ambulatory  Vitals:   12/15/23 1445  BP: (!) 147/81  Pulse: (!) 105  Resp: 16  Temp: 97.9 F (36.6 C)  SpO2: 99%    Filed Weights   12/15/23 1445  Weight: 170 lb 6.4 oz (77.3 kg)     GENERAL: well appearing female in NAD  SKIN: skin color, texture, turgor are normal, no rashes or significant lesions EYES: conjunctiva are pink and non-injected, sclera clear LUNGS: clear to auscultation and percussion with normal breathing effort HEART: regular rate & rhythm and no murmurs and no lower extremity edema Musculoskeletal: no cyanosis of digits and no clubbing  PSYCH: alert & oriented x 3, fluent speech NEURO: no focal motor/sensory deficits  LABORATORY DATA:  I have reviewed the  data as listed    Latest Ref Rng & Units 12/15/2023    2:11 PM 09/15/2023    9:41 AM 03/17/2023    7:58 AM  CBC  WBC 4.0 - 10.5 K/uL 9.0  11.0  8.8   Hemoglobin 12.0 - 15.0 g/dL 86.1  86.8  86.3   Hematocrit 36.0 - 46.0 % 42.5  39.7  40.7   Platelets 150 - 400 K/uL 482  565  533        Latest Ref Rng & Units 12/09/2022   10:40 AM 09/09/2022    8:17 AM 05/20/2022   11:38 AM  CMP  Glucose 70 - 99 mg/dL 65  70  88   BUN 8 - 23 mg/dL 11  13  15    Creatinine 0.44 - 1.00 mg/dL 9.10  9.15  9.10   Sodium 135 - 145 mmol/L 136  135  133   Potassium 3.5 - 5.1 mmol/L 4.2  3.8  4.0   Chloride 98 - 111 mmol/L 103  101  100   CO2 22 - 32 mmol/L 29  29  27    Calcium 8.9 - 10.3 mg/dL 9.1  9.1  9.4   Total Protein 6.5 - 8.1 g/dL 6.5  7.0  7.0   Total Bilirubin 0.3 - 1.2 mg/dL 0.3  0.3  0.4   Alkaline Phos 38 - 126 U/L 61  60  47   AST 15 - 41 U/L 20  19  16   ALT 0 - 44 U/L 14  15  11       ASSESSMENT & PLAN Erica Potts is a 68 y.o. female returns to the clinic for follow up of iron deficiency anemia.   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 -- Iron levels at last visit showed ferritin 18, iron sat 28%.  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   --continue to monitor.   No orders of the defined types were placed in this encounter.   All questions were answered. The patient knows to call the clinic with any problems, questions or concerns.  I have spent a total of 30 minutes minutes of face-to-face and non-face-to-face time, preparing to see the patient, performing a medically  appropriate examination, counseling and educating the patient, ordering medications/tests, documenting clinical information in the electronic health record, and care coordination.   Norleen IVAR Kidney, MD Department of Hematology/Oncology [facility] Phone: [phone] Pager: [phone] Email: [email or web address]

## 2023-12-22 ENCOUNTER — Ambulatory Visit: Payer: Medicare Other | Admitting: Hematology and Oncology

## 2023-12-22 ENCOUNTER — Other Ambulatory Visit: Payer: Medicare Other

## 2024-01-15 ENCOUNTER — Ambulatory Visit
Admission: RE | Admit: 2024-01-15 | Discharge: 2024-01-15 | Disposition: A | Discharge status: Home / Self Care | Payer: Medicare Other | Source: Ambulatory Visit | Attending: Obstetrics and Gynecology | Admitting: Obstetrics and Gynecology

## 2024-01-15 DIAGNOSIS — N6310 Unspecified lump in the right breast, unspecified quadrant: Secondary | ICD-10-CM

## 2024-03-11 ENCOUNTER — Inpatient Hospital Stay: Payer: Medicare Other | Attending: Physician Assistant

## 2024-03-11 DIAGNOSIS — D509 Iron deficiency anemia, unspecified: Secondary | ICD-10-CM | POA: Diagnosis present

## 2024-03-11 DIAGNOSIS — D508 Other iron deficiency anemias: Secondary | ICD-10-CM

## 2024-03-11 DIAGNOSIS — D75839 Thrombocytosis, unspecified: Secondary | ICD-10-CM | POA: Diagnosis not present

## 2024-03-11 DIAGNOSIS — Q2739 Arteriovenous malformation, other site: Secondary | ICD-10-CM | POA: Insufficient documentation

## 2024-03-11 LAB — CBC WITH DIFFERENTIAL (CANCER CENTER ONLY)
Abs Immature Granulocytes: 0.02 10*3/uL (ref 0.00–0.07)
Basophils Absolute: 0.1 10*3/uL (ref 0.0–0.1)
Basophils Relative: 1 %
Eosinophils Absolute: 0.1 10*3/uL (ref 0.0–0.5)
Eosinophils Relative: 1 %
HCT: 36.4 % (ref 36.0–46.0)
Hemoglobin: 12.2 g/dL (ref 12.0–15.0)
Immature Granulocytes: 0 %
Lymphocytes Relative: 21 %
Lymphs Abs: 2.1 10*3/uL (ref 0.7–4.0)
MCH: 30.3 pg (ref 26.0–34.0)
MCHC: 33.5 g/dL (ref 30.0–36.0)
MCV: 90.3 fL (ref 80.0–100.0)
Monocytes Absolute: 0.8 10*3/uL (ref 0.1–1.0)
Monocytes Relative: 8 %
Neutro Abs: 6.8 10*3/uL (ref 1.7–7.7)
Neutrophils Relative %: 69 %
Platelet Count: 405 10*3/uL — ABNORMAL HIGH (ref 150–400)
RBC: 4.03 MIL/uL (ref 3.87–5.11)
RDW: 14.4 % (ref 11.5–15.5)
WBC Count: 9.9 10*3/uL (ref 4.0–10.5)
nRBC: 0 % (ref 0.0–0.2)

## 2024-03-12 LAB — IRON AND IRON BINDING CAPACITY (CC-WL,HP ONLY)
Iron: 41 ug/dL (ref 28–170)
Saturation Ratios: 13 % (ref 10.4–31.8)
TIBC: 322 ug/dL (ref 250–450)
UIBC: 281 ug/dL (ref 148–442)

## 2024-03-12 LAB — FERRITIN: Ferritin: 45 ng/mL (ref 11–307)

## 2024-06-20 ENCOUNTER — Other Ambulatory Visit: Payer: Self-pay | Admitting: Physician Assistant

## 2024-06-20 DIAGNOSIS — D508 Other iron deficiency anemias: Secondary | ICD-10-CM

## 2024-06-20 NOTE — Progress Notes (Signed)
 [facility] [phone]   [phone]) [phone]  PROGRESS NOTE  Patient Care Team: Yolande Toribio MATSU, MD as PCP - General (Internal Medicine)  CHIEF COMPLAINTS/PURPOSE OF CONSULTATION:  Iron deficiency anemia  HISTORY OF PRESENTING ILLNESS:  Erica Potts 68 y.o. female returns for a follow up for iron deficiency anemia. She is unaccompanied for this visit. She was last seen 12/15/2023.  Ms. Grobe reports her energy levels are stable and appropriate for her age.  She is able to complete all her daily activities on her own.  She denies any signs of bleeding including hematochezia, hematuria or hemoptysis.  Patient's stools are dark but she adds that she takes iron pills 2-3 times a week.  She is unable to take her iron pills more often as it is causing her constipation.  She denies fevers, chills, night sweats, shortness of breath, chest pain, cough, headaches or  dizziness.  She has no other complaints.  Rest of the 10 point ROS as below. SABRA   MEDICAL HISTORY:  Past Medical History:  Diagnosis Date   Anxiety    Clostridium difficile infection    Colitis    DDD (degenerative disc disease), lumbar    Depression    HLD (hyperlipidemia)    HTN (hypertension)    IDA (iron deficiency anemia)    Insomnia    Melanoma (HCC) 2000's   Ovarian cyst, left    Palpitations    Spinal stenosis     SURGICAL HISTORY: Past Surgical History:  Procedure Laterality Date   BREAST SURGERY     benign nodule   CESAREAN SECTION  1983, 1986   x 2   COLONOSCOPY     DILATION AND CURETTAGE OF UTERUS  2012   ESOPHAGOGASTRODUODENOSCOPY (EGD) WITH PROPOFOL N/A 08/17/2021   Procedure: ESOPHAGOGASTRODUODENOSCOPY (EGD) WITH PROPOFOL;  Surgeon: Abran Norleen SAILOR, MD;  Location: [facility];  Service: Endoscopy;  Laterality: N/A;   LAPAROSCOPIC TOTAL HYSTERECTOMY  2013   heavy bleeding   MELANOMA EXCISION     MENISCUS REPAIR Left 2011   MOUTH SURGERY Right    maxilary    nipple biopsy     OVARIAN CYST REMOVAL  1990   POLYPECTOMY     SHOULDER ARTHROSCOPY DISTAL CLAVICLE EXCISION AND OPEN ROTATOR CUFF REPAIR     Aug 12 2022   TUBAL LIGATION     UPPER GASTROINTESTINAL ENDOSCOPY      SOCIAL HISTORY: Social History   Socioeconomic History   Marital status: Divorced    Spouse name: Not on file   Number of children: 2   Years of education: Not on file   Highest education level: Not on file  Occupational History   Occupation: RN    Comment: [employer].  Tobacco Use   Smoking status: Former    Current packs/day: 0.00    Types: Cigarettes    Quit date: 11/17/1991    Years since quitting: 32.6   Smokeless tobacco: Never  Vaping Use   Vaping status: Never Used  Substance and Sexual Activity   Alcohol use: Yes    Alcohol/week: 7.0 standard drinks of alcohol    Types: 7 Standard drinks or equivalent per week    Comment: daily   Drug use: No   Sexual activity: Not on file  Other Topics Concern   Not on file  Social History Narrative   Not on file   Social Drivers of Health   Financial Resource Strain: Not on file  Food  Insecurity: Not on file  Transportation Needs: Not on file  Physical Activity: Not on file  Stress: Not on file  Social Connections: Not on file  Intimate Partner Violence: Not on file    FAMILY HISTORY: Family History  Problem Relation Age of Onset   Heart attack Mother        possible blood disorder/cancer   Prostate cancer Father    Colon polyps Father 43   Dementia Father    Esophageal cancer Paternal Grandfather    Lung cancer Paternal Grandfather    Colon cancer Neg Hx    Rectal cancer Neg Hx    Stomach cancer Neg Hx    Pancreatic cancer Neg Hx    Liver disease Neg Hx     ALLERGIES:  has no known allergies.  MEDICATIONS:  Current Outpatient Medications  Medication Sig Dispense Refill   ALPRAZolam (XANAX) 0.5 MG tablet Take 0.5 mg by mouth at bedtime as needed for sleep. Take 1/2 -1 tab at bedtime  as needed     Ascorbic Acid (VITAMIN C) 100 MG tablet Take 100 mg by mouth daily.     buPROPion (WELLBUTRIN SR) 150 MG 12 hr tablet Take 150 mg by mouth every morning.     buPROPion (WELLBUTRIN) 75 MG tablet Take 75 mg by mouth every morning.     celecoxib (CELEBREX) 100 MG capsule Take 100 mg by mouth 2 (two) times daily.     cyclobenzaprine (FLEXERIL) 10 MG tablet Take 10 mg by mouth at bedtime.     docusate sodium (COLACE) 100 MG capsule Take 100 mg by mouth 2 (two) times daily.     DULoxetine (CYMBALTA) 30 MG capsule Take 30 mg by mouth daily.     ferrous sulfate 325 (65 FE) MG EC tablet TAKE 1 TABLET BY MOUTH EVERY DAY WITH BREAKFAST 90 tablet 1   gabapentin (NEURONTIN) 300 MG capsule Take 600 mg by mouth at bedtime. Take two capsules by mouth at bedtime as needed     HYDROcodone-acetaminophen (NORCO) 7.5-325 MG per tablet Take 2 tablets by mouth 3 (three) times daily as needed for pain. Patient only taking 2 tablets three times daily     hydrocortisone 2.5 % ointment Apply 1 g topically 3 (three) times daily.     lisinopril (PRINIVIL,ZESTRIL) 10 MG tablet Take 10 mg by mouth daily.     meloxicam (MOBIC) 15 MG tablet Take 15 mg by mouth daily.     Multiple Vitamin (MULTIVITAMIN) tablet Take 1 tablet by mouth daily.     Omega-3 Fatty Acids (FISH OIL) 1000 MG CAPS Take 2 capsules by mouth daily.     pantoprazole (PROTONIX) 40 MG tablet TAKE 1 TABLET BY MOUTH TWICE A DAY 180 tablet 1   saccharomyces boulardii (FLORASTOR) 250 MG capsule Take 500 mg by mouth daily.      tretinoin (RETIN-A) 0.025 % cream tretinoin 0.025 % topical cream  APPLY TO AFFECTED AREA EVERY DAY AT NIGHT     VITAMIN D PO Take 2,000 Units by mouth daily.      zolpidem (AMBIEN) 10 MG tablet Take 10 mg by mouth at bedtime as needed for sleep.     No current facility-administered medications for this visit.    REVIEW OF SYSTEMS:   Constitutional: ( - ) fevers, ( - )  chills , ( - ) night sweats Eyes: ( - ) blurriness of  vision, ( - ) double vision, ( - ) watery eyes Ears, nose, mouth, throat,  and face: ( - ) mucositis, ( - ) sore throat Respiratory: ( - ) cough, ( - ) dyspnea, ( - ) wheezes Cardiovascular: ( - ) palpitation, ( - ) chest discomfort, ( - ) lower extremity swelling Gastrointestinal:  ( - ) nausea, ( - ) heartburn, ( -) change in bowel habits Skin: ( - ) abnormal skin rashes Lymphatics: ( - ) new lymphadenopathy, ( - ) easy bruising Neurological: ( - ) numbness, ( - ) tingling, ( - ) new weaknesses Behavioral/Psych: ( - ) mood change, ( - ) new changes  All other systems were reviewed with the patient and are negative.  PHYSICAL EXAMINATION: ECOG PERFORMANCE STATUS: 1 - Symptomatic but completely ambulatory  Vitals:   06/21/24 0832  BP: (!) 142/68  Pulse: 86  Resp: 16  Temp: (!) 97.5 F (36.4 C)  SpO2: 98%     Filed Weights   06/21/24 0832  Weight: 167 lb 8 oz (76 kg)      GENERAL: well appearing female in NAD  SKIN: skin color, texture, turgor are normal, no rashes or significant lesions EYES: conjunctiva are pink and non-injected, sclera clear LUNGS: clear to auscultation and percussion with normal breathing effort HEART: regular rate & rhythm and no murmurs and no lower extremity edema Musculoskeletal: no cyanosis of digits and no clubbing  PSYCH: alert & oriented x 3, fluent speech NEURO: no focal motor/sensory deficits  LABORATORY DATA:  I have reviewed the data as listed    Latest Ref Rng & Units 06/21/2024    7:49 AM 03/11/2024    2:30 PM 12/15/2023    2:11 PM  CBC  WBC 4.0 - 10.5 K/uL 9.3  9.9  9.0   Hemoglobin 12.0 - 15.0 g/dL 86.1  87.7  86.1   Hematocrit 36.0 - 46.0 % 41.4  36.4  42.5   Platelets 150 - 400 K/uL 537  405  482        Latest Ref Rng & Units 12/09/2022   10:40 AM 09/09/2022    8:17 AM 05/20/2022   11:38 AM  CMP  Glucose 70 - 99 mg/dL 65  70  88   BUN 8 - 23 mg/dL 11  13  15    Creatinine 0.44 - 1.00 mg/dL 9.10  9.15  9.10   Sodium 135 - 145  mmol/L 136  135  133   Potassium 3.5 - 5.1 mmol/L 4.2  3.8  4.0   Chloride 98 - 111 mmol/L 103  101  100   CO2 22 - 32 mmol/L 29  29  27    Calcium 8.9 - 10.3 mg/dL 9.1  9.1  9.4   Total Protein 6.5 - 8.1 g/dL 6.5  7.0  7.0   Total Bilirubin 0.3 - 1.2 mg/dL 0.3  0.3  0.4   Alkaline Phos 38 - 126 U/L 61  60  47   AST 15 - 41 U/L 20  19  16    ALT 0 - 44 U/L 14  15  11       ASSESSMENT & PLAN MAHA FISCHEL is a 68 y.o. female returns to the clinic for follow up of iron deficiency anemia.   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 --Labs today show no evidence of anemia with hemoglobin 13.8, MCV 90.6.SABRA Iron panel is pending. --Discussed trying to take iron pills every other day if tolerable.  Okay to discontinue iron pills  if patient's constipation is unbearable.  We can manage her iron deficiency with IV iron moving forward. --I will follow-up with the patient once iron panel is finalized to determine if she needs IV iron at this time. 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  --continue to monitor.   No orders of the defined types were placed in this encounter.   All questions were answered. The patient knows to call the clinic with any problems, questions or concerns.  I have spent a total of 25 minutes minutes of face-to-face and non-face-to-face time, preparing to see the patient, performing a medically appropriate examination, counseling and educating the patient, ordering medications/tests, documenting clinical information in the electronic health record, and care coordination.   Johnston Police PA-C Dept of Hematology and Oncology [facility] Phone: [phone]

## 2024-06-21 ENCOUNTER — Inpatient Hospital Stay: Payer: Medicare Other | Attending: Physician Assistant

## 2024-06-21 ENCOUNTER — Ambulatory Visit: Payer: Self-pay | Admitting: Physician Assistant

## 2024-06-21 ENCOUNTER — Inpatient Hospital Stay (HOSPITAL_BASED_OUTPATIENT_CLINIC_OR_DEPARTMENT_OTHER): Payer: Medicare Other | Admitting: Physician Assistant

## 2024-06-21 VITALS — BP 142/68 | HR 86 | Temp 97.5°F | Resp 16 | Wt 167.5 lb

## 2024-06-21 DIAGNOSIS — Z79899 Other long term (current) drug therapy: Secondary | ICD-10-CM | POA: Insufficient documentation

## 2024-06-21 DIAGNOSIS — D508 Other iron deficiency anemias: Secondary | ICD-10-CM

## 2024-06-21 DIAGNOSIS — Z808 Family history of malignant neoplasm of other organs or systems: Secondary | ICD-10-CM | POA: Diagnosis not present

## 2024-06-21 DIAGNOSIS — D509 Iron deficiency anemia, unspecified: Secondary | ICD-10-CM | POA: Insufficient documentation

## 2024-06-21 DIAGNOSIS — Z87891 Personal history of nicotine dependence: Secondary | ICD-10-CM | POA: Diagnosis not present

## 2024-06-21 DIAGNOSIS — Z83719 Family history of colon polyps, unspecified: Secondary | ICD-10-CM | POA: Insufficient documentation

## 2024-06-21 DIAGNOSIS — D75839 Thrombocytosis, unspecified: Secondary | ICD-10-CM

## 2024-06-21 DIAGNOSIS — Z8042 Family history of malignant neoplasm of prostate: Secondary | ICD-10-CM | POA: Diagnosis not present

## 2024-06-21 DIAGNOSIS — Q2739 Arteriovenous malformation, other site: Secondary | ICD-10-CM | POA: Diagnosis present

## 2024-06-21 DIAGNOSIS — Z801 Family history of malignant neoplasm of trachea, bronchus and lung: Secondary | ICD-10-CM | POA: Diagnosis not present

## 2024-06-21 LAB — IRON AND IRON BINDING CAPACITY (CC-WL,HP ONLY)
Iron: 77 ug/dL (ref 28–170)
Saturation Ratios: 21 % (ref 10.4–31.8)
TIBC: 375 ug/dL (ref 250–450)
UIBC: 298 ug/dL (ref 148–442)

## 2024-06-21 LAB — CBC WITH DIFFERENTIAL (CANCER CENTER ONLY)
Abs Immature Granulocytes: 0.03 K/uL (ref 0.00–0.07)
Basophils Absolute: 0.1 K/uL (ref 0.0–0.1)
Basophils Relative: 1 %
Eosinophils Absolute: 0.1 K/uL (ref 0.0–0.5)
Eosinophils Relative: 1 %
HCT: 41.4 % (ref 36.0–46.0)
Hemoglobin: 13.8 g/dL (ref 12.0–15.0)
Immature Granulocytes: 0 %
Lymphocytes Relative: 17 %
Lymphs Abs: 1.6 K/uL (ref 0.7–4.0)
MCH: 30.2 pg (ref 26.0–34.0)
MCHC: 33.3 g/dL (ref 30.0–36.0)
MCV: 90.6 fL (ref 80.0–100.0)
Monocytes Absolute: 0.6 K/uL (ref 0.1–1.0)
Monocytes Relative: 6 %
Neutro Abs: 7 K/uL (ref 1.7–7.7)
Neutrophils Relative %: 75 %
Platelet Count: 537 K/uL — ABNORMAL HIGH (ref 150–400)
RBC: 4.57 MIL/uL (ref 3.87–5.11)
RDW: 14.5 % (ref 11.5–15.5)
WBC Count: 9.3 K/uL (ref 4.0–10.5)
nRBC: 0 % (ref 0.0–0.2)

## 2024-06-21 LAB — FERRITIN: Ferritin: 59 ng/mL (ref 11–307)

## 2024-08-23 ENCOUNTER — Other Ambulatory Visit: Payer: Self-pay | Admitting: Hematology and Oncology

## 2024-09-05 ENCOUNTER — Ambulatory Visit (HOSPITAL_COMMUNITY)

## 2024-09-08 ENCOUNTER — Other Ambulatory Visit: Payer: Self-pay | Admitting: Hematology and Oncology

## 2024-09-08 DIAGNOSIS — D508 Other iron deficiency anemias: Secondary | ICD-10-CM

## 2024-09-09 ENCOUNTER — Inpatient Hospital Stay: Attending: Physician Assistant

## 2024-09-09 DIAGNOSIS — D75839 Thrombocytosis, unspecified: Secondary | ICD-10-CM | POA: Insufficient documentation

## 2024-09-09 DIAGNOSIS — K5520 Angiodysplasia of colon without hemorrhage: Secondary | ICD-10-CM | POA: Insufficient documentation

## 2024-09-09 DIAGNOSIS — D508 Other iron deficiency anemias: Secondary | ICD-10-CM

## 2024-09-09 DIAGNOSIS — D500 Iron deficiency anemia secondary to blood loss (chronic): Secondary | ICD-10-CM | POA: Insufficient documentation

## 2024-09-09 LAB — RETIC PANEL
Immature Retic Fract: 7 % (ref 2.3–15.9)
RBC.: 4.58 MIL/uL (ref 3.87–5.11)
Retic Count, Absolute: 54 K/uL (ref 19.0–186.0)
Retic Ct Pct: 1.2 % (ref 0.4–3.1)
Reticulocyte Hemoglobin: 30 pg (ref >27.9)

## 2024-09-09 LAB — CBC WITH DIFFERENTIAL (CANCER CENTER ONLY)
Abs Immature Granulocytes: 0.01 K/uL (ref 0.00–0.07)
Basophils Absolute: 0.1 K/uL (ref 0.0–0.1)
Basophils Relative: 1 %
Eosinophils Absolute: 0.1 K/uL (ref 0.0–0.5)
Eosinophils Relative: 2 %
HCT: 40.7 % (ref 36.0–46.0)
Hemoglobin: 13.3 g/dL (ref 12.0–15.0)
Immature Granulocytes: 0 %
Lymphocytes Relative: 21 %
Lymphs Abs: 1.7 K/uL (ref 0.7–4.0)
MCH: 29.1 pg (ref 26.0–34.0)
MCHC: 32.7 g/dL (ref 30.0–36.0)
MCV: 89.1 fL (ref 80.0–100.0)
Monocytes Absolute: 0.6 K/uL (ref 0.1–1.0)
Monocytes Relative: 8 %
Neutro Abs: 5.7 K/uL (ref 1.7–7.7)
Neutrophils Relative %: 68 %
Platelet Count: 528 K/uL — ABNORMAL HIGH (ref 150–400)
RBC: 4.57 MIL/uL (ref 3.87–5.11)
RDW: 13.3 % (ref 11.5–15.5)
WBC Count: 8.2 K/uL (ref 4.0–10.5)
nRBC: 0 % (ref 0.0–0.2)

## 2024-09-09 LAB — CMP (CANCER CENTER ONLY)
ALT: 18 U/L (ref 0–44)
AST: 22 U/L (ref 15–41)
Albumin: 4.4 g/dL (ref 3.5–5.0)
Alkaline Phosphatase: 56 U/L (ref 38–126)
Anion gap: 7 (ref 5–15)
BUN: 12 mg/dL (ref 8–23)
CO2: 28 mmol/L (ref 22–32)
Calcium: 9.1 mg/dL (ref 8.9–10.3)
Chloride: 101 mmol/L (ref 98–111)
Creatinine: 0.84 mg/dL (ref 0.44–1.00)
GFR, Estimated: >60 mL/min (ref >60)
Glucose, Bld: 77 mg/dL (ref 70–99)
Potassium: 3.9 mmol/L (ref 3.5–5.1)
Sodium: 136 mmol/L (ref 135–145)
Total Bilirubin: 0.4 mg/dL (ref 0.0–1.2)
Total Protein: 7.2 g/dL (ref 6.5–8.1)

## 2024-09-09 LAB — IRON AND IRON BINDING CAPACITY (CC-WL,HP ONLY)
Iron: 39 ug/dL (ref 28–170)
Saturation Ratios: 11 % (ref 10.4–31.8)
TIBC: 356 ug/dL (ref 250–450)
UIBC: 317 ug/dL (ref 148–442)

## 2024-09-09 LAB — FERRITIN: Ferritin: 30 ng/mL (ref 11–307)

## 2024-10-17 ENCOUNTER — Emergency Department (HOSPITAL_BASED_OUTPATIENT_CLINIC_OR_DEPARTMENT_OTHER)
Admission: EM | Admit: 2024-10-17 | Discharge: 2024-10-17 | Disposition: A | Discharge status: Home / Self Care | Attending: Emergency Medicine | Admitting: Emergency Medicine

## 2024-10-17 ENCOUNTER — Emergency Department (HOSPITAL_BASED_OUTPATIENT_CLINIC_OR_DEPARTMENT_OTHER): Admitting: Radiology

## 2024-10-17 ENCOUNTER — Encounter (HOSPITAL_BASED_OUTPATIENT_CLINIC_OR_DEPARTMENT_OTHER): Payer: Self-pay

## 2024-10-17 DIAGNOSIS — Z79899 Other long term (current) drug therapy: Secondary | ICD-10-CM | POA: Insufficient documentation

## 2024-10-17 DIAGNOSIS — M79672 Pain in left foot: Secondary | ICD-10-CM | POA: Diagnosis present

## 2024-10-17 MED ORDER — PREDNISONE 20 MG PO TABS
20.0000 mg | ORAL_TABLET | Freq: Once | ORAL | Status: DC
  Filled 2024-10-17: qty 1

## 2024-10-17 MED ORDER — PREDNISONE 10 MG PO TABS: 20.0000 mg | ORAL_TABLET | Freq: Two times a day (BID) | ORAL | 0 refills | Status: AC

## 2024-10-17 NOTE — ED Triage Notes (Signed)
 Pt c/o L foot pain/ swelling. Chronic in nature, first began after trip to [place] in August. Dx by ortho w overgrowth of bone. Advises pain is 10/10 tonight, unable to sleep

## 2024-10-17 NOTE — ED Provider Notes (Signed)
 [place] EMERGENCY DEPARTMENT AT [facility] Provider Note   CSN: [id number] Arrival date & time: 10/17/24  0111     Patient presents with: No chief complaint on file.   Erica Potts is a 68 y.o. female.   Patient is a 68 year old female presenting with left foot pain.  She has had issues with her sesamoid and has been seen by orthopedics.  They recommended surgery, but patient wants a second opinion.  She is awaiting to see orthopedics for this.  Tonight, her foot began hurting more significantly and presents for evaluation of this.  This woke her up in the night and caused her severe pain.  No new injury or trauma.       Prior to Admission medications  Medication Sig Start Date End Date Taking? Authorizing Provider  ALPRAZolam (XANAX) 0.5 MG tablet Take 0.5 mg by mouth at bedtime as needed for sleep. Take 1/2 -1 tab at bedtime as needed    [provider]  Ascorbic Acid (VITAMIN C) 100 MG tablet Take 100 mg by mouth daily.    [provider]  buPROPion (WELLBUTRIN SR) 150 MG 12 hr tablet Take 150 mg by mouth every morning.    [provider]  buPROPion (WELLBUTRIN) 75 MG tablet Take 75 mg by mouth every morning.    [provider]  celecoxib (CELEBREX) 100 MG capsule Take 100 mg by mouth 2 (two) times daily.    [provider]  cyclobenzaprine (FLEXERIL) 10 MG tablet Take 10 mg by mouth at bedtime.    [provider]  docusate sodium (COLACE) 100 MG capsule Take 100 mg by mouth 2 (two) times daily.    [provider]  DULoxetine (CYMBALTA) 30 MG capsule Take 30 mg by mouth daily. 01/25/22   [provider]  ferrous sulfate  325 (65 FE) MG EC tablet TAKE ONE TABLET BY MOUTH EVERY DAY with breakfast 08/23/24   Dorsey, John T IV, MD  gabapentin (NEURONTIN) 300 MG capsule Take 600 mg by mouth at bedtime. Take two capsules by mouth at bedtime as needed    [provider]  hydrocortisone 2.5 %  ointment Apply 1 g topically 3 (three) times daily. 09/18/22   [provider]  lisinopril (PRINIVIL,ZESTRIL) 10 MG tablet Take 10 mg by mouth daily.    [provider]  Multiple Vitamin (MULTIVITAMIN) tablet Take 1 tablet by mouth daily.    [provider]  Omega-3 Fatty Acids (FISH OIL) 1000 MG CAPS Take 2 capsules by mouth daily.    [provider]  pantoprazole  (PROTONIX ) 40 MG tablet TAKE 1 TABLET BY MOUTH TWICE A DAY 10/13/23   Abran Norleen SAILOR, MD  saccharomyces boulardii (FLORASTOR) 250 MG capsule Take 500 mg by mouth daily.     [provider]  tretinoin (RETIN-A) 0.025 % cream tretinoin 0.025 % topical cream  APPLY TO AFFECTED AREA EVERY DAY AT NIGHT    [provider]  VITAMIN D PO Take 2,000 Units by mouth daily.     [provider]  zolpidem (AMBIEN) 10 MG tablet Take 10 mg by mouth at bedtime as needed for sleep.    [provider]    Allergies: Patient has no known allergies.    Review of Systems  All other systems reviewed and are negative.   Updated Vital Signs BP (!) 132/100   Pulse (!) 104   Temp 97.7 F (36.5 C)   Resp 20   Wt 68  kg   SpO2 100%   BMI 24.21 kg/m   Physical Exam Vitals and nursing note reviewed.  HENT:     Head: Normocephalic.  Pulmonary:     Effort: Pulmonary effort is normal.  Musculoskeletal:     Comments: There is pain in the dorsum of the foot, however no significant swelling or abnormal findings.  She has good range of motion of the first MTP joint.  Skin:    General: Skin is warm and dry.  Neurological:     Mental Status: She is alert and oriented to person, place, and time.     (all labs ordered are listed, but only abnormal results are displayed) Labs Reviewed - No data to display  EKG: None  Radiology: DG Foot 2 Views Left Result Date: 10/17/2024 EXAM: 2 VIEW(S) XRAY OF THE LEFT FOOT 10/17/2024 02:55:10 AM COMPARISON: None available. CLINICAL HISTORY:  Injury Injury FINDINGS: BONES AND JOINTS: No acute fracture. No malalignment. SOFT TISSUES: The soft tissues are unremarkable. IMPRESSION: 1. No significant abnormality. Electronically signed by: Franky Crease MD 10/17/2024 03:06 AM EST RP Workstation: [id number]     Procedures   Medications Ordered in the ED  predniSONE (DELTASONE) tablet 20 mg (has no administration in time range)                                    Medical Decision Making Amount and/or Complexity of Data Reviewed Radiology: ordered.  Risk Prescription drug management.   I will treat with prednisone and see if this helps.  She can continue pain medications at home as previously prescribed.  To follow-up with orthopedics.     Final diagnoses:  None    ED Discharge Orders     None          Geroldine Berg, MD 10/17/24 [phone]

## 2024-10-17 NOTE — Discharge Instructions (Signed)
 Begin taking prednisone as prescribed.  Follow-up with orthopedics if symptoms are not improving.

## 2024-10-28 ENCOUNTER — Ambulatory Visit (INDEPENDENT_AMBULATORY_CARE_PROVIDER_SITE_OTHER): Admitting: Orthopedic Surgery

## 2024-10-28 ENCOUNTER — Encounter: Payer: Self-pay | Admitting: Orthopedic Surgery

## 2024-10-28 DIAGNOSIS — M6702 Short Achilles tendon (acquired), left ankle: Secondary | ICD-10-CM | POA: Diagnosis not present

## 2024-10-28 DIAGNOSIS — M79672 Pain in left foot: Secondary | ICD-10-CM | POA: Diagnosis not present

## 2024-10-28 NOTE — Progress Notes (Signed)
 "  Office Visit Note   Patient: Erica Potts           Date of Birth: 05-31-1956           MRN: [id number] Visit Date: 10/28/2024              Requested by: Yolande Toribio MATSU, MD [address] [place],  [place] [address] PCP: Yolande Toribio MATSU, MD  Chief Complaint  Patient presents with   Left Foot - Pain      HPI: Discussed the use of AI scribe software for clinical note transcription with the patient, who gave verbal consent to proceed.  History of Present Illness Erica Potts Donny is a 68 year old female with left foot sesamoiditis who presents for a second opinion regarding persistent left foot pain and swelling.  She developed left foot pain and swelling in August 2025 following extensive ambulation during travel. The pain is localized to the sesamoid region and was initially associated with swelling. She denies any antecedent trauma or injury.  Radiographs were performed and findings were discussed with the patient. She reports being told by a prior clinician that there was increased bone growth in the sesamoid region.  She has pursued conservative management, including specialized arch support and cushioning for footwear, which she uses during exercise and ambulation. Despite these interventions, she continues to experience pain and seeks non-operative alternatives, expressing a strong preference to avoid surgery.  She notes a higher arch on the affected side and persistent tightness in her left Achilles tendon. She spends most of her workday sitting and attends yoga classes twice weekly, but stretching has not provided adequate relief. No other significant symptoms were reported.     Assessment & Plan: Visit Diagnoses:  1. Achilles tendon contracture, left   2. 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 Nonoperative management preferred to avoid further overload and hallux deviation. - Recommended SOLE orthotics with metatarsal pad to redistribute pressure from sesamoids. - Instructed to monitor and trim orthotic pressure points as needed. - Advised use of supportive sneakers with rigid forefoot. - Discussed nonoperative management as preferred approach. - Advised return for reassessment if symptoms worsen or fail to improve.  Plantar flexed first ray with tight left Achilles tendon Plantar flexed first ray and chronic tightness of left Achilles tendon contribute to altered foot biomechanics and sesamoid overload. Achilles tightness likely chronic and related to activity and occupation. - Instructed on daily Achilles tendon stretching exercises using stairs, with both knees straight and then one knee relaxed, seven days per week for one to two minutes per session. - Reinforced importance of stretching beyond current yoga practice to address Achilles tightness and improve foot biomechanics.      Follow-Up Instructions: Return if symptoms worsen or fail to improve.   Ortho Exam  Patient is alert, oriented, no adenopathy, well-dressed, normal affect, normal respiratory effort. Physical Exam CARDIOVASCULAR: Peripheral pulses palpable. MUSCULOSKELETAL: Tight Achilles tendon. Increased arch height on right foot. With her knee extended she has dorsiflexion just short of neutral on the left.  She has good dorsiflexion on the right.  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  Patient has minimal tenderness to palpation beneath the sesamoids at  this time.  Review of her radiographs did not show a bipartite sesamoid but did show increased sclerotic changes in both sesamoids    Imaging: No results found. No images are attached to the encounter.  Labs: No results  found for: HGBA1C, ESRSEDRATE, CRP, LABURIC, REPTSTATUS, GRAMSTAIN, CULT, LABORGA   Lab Results  Component Value Date   ALBUMIN 4.4 09/09/2024   ALBUMIN 4.0 12/09/2022   ALBUMIN 4.2 09/09/2022    No results found for: MG No results found for: VD25OH  No results found for: PREALBUMIN    Latest Ref Rng & Units 09/09/2024    7:47 AM 09/09/2024    7:46 AM 06/21/2024    7:49 AM  CBC EXTENDED  WBC 4.0 - 10.5 K/uL  8.2  9.3   RBC 3.87 - 5.11 MIL/uL 4.58  4.57  4.57   Hemoglobin 12.0 - 15.0 g/dL  86.6  86.1   HCT 63.9 - 46.0 %  40.7  41.4   Platelets 150 - 400 K/uL  528  537   NEUT# 1.7 - 7.7 K/uL  5.7  7.0   Lymph# 0.7 - 4.0 K/uL  1.7  1.6      There is no height or weight on file to calculate BMI.  Orders:  No orders of the defined types were placed in this encounter.  No orders of the defined types were placed in this encounter.    Procedures: No procedures performed  Clinical Data: No additional findings.  ROS:  All other systems negative, except as noted in the HPI. Review of Systems  Objective: Vital Signs: There were no vitals taken for this visit.  Specialty Comments:  No specialty comments available.  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/2021   History of ulcer disease 12/21/2021   AVM (arteriovenous malformation)    Iron deficiency anemia    Genital herpes simplex 05/26/2021   Hypertensive disorder 05/26/2021   Abnormal weight loss 11/12/2018   Abnormal feces 11/28/2012   Hyperlipidemia 11/16/2012   Cyst of ovary 10/08/2009   Past Medical History:  Diagnosis Date   Anxiety    Clostridium difficile infection    Colitis    DDD (degenerative disc disease), lumbar    Depression    HLD (hyperlipidemia)    HTN (hypertension)    IDA (iron deficiency anemia)    Insomnia    Melanoma (HCC) 2000's   Ovarian cyst, left    Palpitations     Spinal stenosis     Family History  Problem Relation Age of Onset   Heart attack Mother        possible blood disorder/cancer   Prostate cancer Father    Colon polyps Father 62   Dementia Father    Esophageal cancer Paternal Grandfather    Lung cancer Paternal Grandfather    Colon cancer Neg Hx    Rectal cancer Neg Hx    Stomach cancer Neg Hx    Pancreatic cancer Neg Hx    Liver disease Neg Hx     Past Surgical History:  Procedure Laterality Date   BREAST SURGERY     benign nodule   CESAREAN SECTION  1983, 1986   x 2   COLONOSCOPY     DILATION AND CURETTAGE OF UTERUS  2012   ESOPHAGOGASTRODUODENOSCOPY (EGD) WITH PROPOFOL  N/A 08/17/2021   Procedure: ESOPHAGOGASTRODUODENOSCOPY (EGD) WITH PROPOFOL ;  Surgeon: Abran Norleen SAILOR, MD;  Location: [facility];  Service: Endoscopy;  Laterality: N/A;   LAPAROSCOPIC TOTAL HYSTERECTOMY  2013   heavy bleeding   MELANOMA EXCISION     MENISCUS REPAIR Left 2011   MOUTH SURGERY Right    maxilary   nipple biopsy     OVARIAN CYST REMOVAL  1990   POLYPECTOMY     SHOULDER ARTHROSCOPY DISTAL CLAVICLE EXCISION AND OPEN ROTATOR CUFF REPAIR     Aug 12 2022   TUBAL LIGATION     UPPER GASTROINTESTINAL ENDOSCOPY     Social History   Occupational History   Occupation: [employer]    Comment: [employer].  Tobacco Use   Smoking status: Former    Current packs/day: 0.00    Average packs/day: 1.0 packs/day    Types: Cigarettes    Quit date: 11/17/1991    Years since quitting: 32.9   Smokeless tobacco: Never  Vaping Use   Vaping status: Never Used  Substance and Sexual Activity   Alcohol  use: Yes    Alcohol /week: 7.0 standard drinks of alcohol     Types: 7 Standard drinks or equivalent per week    Comment: daily   Drug use: No   Sexual activity: Not on file         "

## 2024-12-13 ENCOUNTER — Inpatient Hospital Stay: Attending: Physician Assistant

## 2024-12-13 ENCOUNTER — Inpatient Hospital Stay: Admitting: Hematology and Oncology

## 2024-12-13 VITALS — BP 130/85 | HR 100 | Temp 97.2°F | Resp 17 | Wt 160.7 lb

## 2024-12-13 DIAGNOSIS — D509 Iron deficiency anemia, unspecified: Secondary | ICD-10-CM

## 2024-12-13 DIAGNOSIS — D508 Other iron deficiency anemias: Secondary | ICD-10-CM

## 2024-12-13 DIAGNOSIS — D75839 Thrombocytosis, unspecified: Secondary | ICD-10-CM

## 2024-12-13 LAB — CBC WITH DIFFERENTIAL (CANCER CENTER ONLY)
Abs Immature Granulocytes: 0.02 10*3/uL (ref 0.00–0.07)
Basophils Absolute: 0.1 10*3/uL (ref 0.0–0.1)
Basophils Relative: 1 %
Eosinophils Absolute: 0.1 10*3/uL (ref 0.0–0.5)
Eosinophils Relative: 1 %
HCT: 39.1 % (ref 36.0–46.0)
Hemoglobin: 12.9 g/dL (ref 12.0–15.0)
Immature Granulocytes: 0 %
Lymphocytes Relative: 19 %
Lymphs Abs: 1.5 10*3/uL (ref 0.7–4.0)
MCH: 28.5 pg (ref 26.0–34.0)
MCHC: 33 g/dL (ref 30.0–36.0)
MCV: 86.3 fL (ref 80.0–100.0)
Monocytes Absolute: 0.6 10*3/uL (ref 0.1–1.0)
Monocytes Relative: 7 %
Neutro Abs: 5.5 10*3/uL (ref 1.7–7.7)
Neutrophils Relative %: 72 %
Platelet Count: 564 10*3/uL — ABNORMAL HIGH (ref 150–400)
RBC: 4.53 MIL/uL (ref 3.87–5.11)
RDW: 14.5 % (ref 11.5–15.5)
WBC Count: 7.7 10*3/uL (ref 4.0–10.5)
nRBC: 0 % (ref 0.0–0.2)

## 2024-12-13 LAB — IRON AND IRON BINDING CAPACITY (CC-WL,HP ONLY)
Iron: 47 ug/dL (ref 28–170)
Saturation Ratios: 11 % (ref 10.4–31.8)
TIBC: 424 ug/dL (ref 250–450)
UIBC: 378 ug/dL

## 2024-12-13 LAB — CMP (CANCER CENTER ONLY)
ALT: 18 U/L (ref 0–44)
AST: 25 U/L (ref 15–41)
Albumin: 4.7 g/dL (ref 3.5–5.0)
Alkaline Phosphatase: 64 U/L (ref 38–126)
Anion gap: 11 (ref 5–15)
BUN: 11 mg/dL (ref 8–23)
CO2: 27 mmol/L (ref 22–32)
Calcium: 9.4 mg/dL (ref 8.9–10.3)
Chloride: 98 mmol/L (ref 98–111)
Creatinine: 0.88 mg/dL (ref 0.44–1.00)
GFR, Estimated: >60 mL/min
Glucose, Bld: 103 mg/dL — ABNORMAL HIGH (ref 70–99)
Potassium: 3.9 mmol/L (ref 3.5–5.1)
Sodium: 137 mmol/L (ref 135–145)
Total Bilirubin: 0.4 mg/dL (ref 0.0–1.2)
Total Protein: 7.3 g/dL (ref 6.5–8.1)

## 2024-12-13 LAB — FERRITIN: Ferritin: 55 ng/mL (ref 11–307)

## 2024-12-13 NOTE — Progress Notes (Unsigned)
 " [facility] [phone]   [phone]) [phone]  PROGRESS NOTE  Patient Care Team: Yolande Toribio MATSU, MD as PCP - General (Internal Medicine)  CHIEF COMPLAINTS/PURPOSE OF CONSULTATION:  Iron deficiency anemia  HISTORY OF PRESENTING ILLNESS:  Erica Potts 69 y.o. female returns for a follow up for iron deficiency anemia. She is unaccompanied for this visit. She was last seen 12/15/2023.  Ms. Willden reports *** .   MEDICAL HISTORY:  Past Medical History:  Diagnosis Date   Anxiety    Clostridium difficile infection    Colitis    DDD (degenerative disc disease), lumbar    Depression    HLD (hyperlipidemia)    HTN (hypertension)    IDA (iron deficiency anemia)    Insomnia    Melanoma (HCC) 2000's   Ovarian cyst, left    Palpitations    Spinal stenosis     SURGICAL HISTORY: Past Surgical History:  Procedure Laterality Date   BREAST SURGERY     benign nodule   CESAREAN SECTION  1983, 1986   x 2   COLONOSCOPY     DILATION AND CURETTAGE OF UTERUS  2012   ESOPHAGOGASTRODUODENOSCOPY (EGD) WITH PROPOFOL  N/A 08/17/2021   Procedure: ESOPHAGOGASTRODUODENOSCOPY (EGD) WITH PROPOFOL ;  Surgeon: Abran Norleen SAILOR, MD;  Location: [facility];  Service: Endoscopy;  Laterality: N/A;   LAPAROSCOPIC TOTAL HYSTERECTOMY  2013   heavy bleeding   MELANOMA EXCISION     MENISCUS REPAIR Left 2011   MOUTH SURGERY Right    maxilary   nipple biopsy     OVARIAN CYST REMOVAL  1990   POLYPECTOMY     SHOULDER ARTHROSCOPY DISTAL CLAVICLE EXCISION AND OPEN ROTATOR CUFF REPAIR     Aug 12 2022   TUBAL LIGATION     UPPER GASTROINTESTINAL ENDOSCOPY      SOCIAL HISTORY: Social History   Socioeconomic History   Marital status: Significant Other    Spouse name: Not on file   Number of children: 2   Years of education: Not on file   Highest education level: Not on file  Occupational History   Occupation: RN    Comment: [employer].  Tobacco Use    Smoking status: Former    Current packs/day: 0.00    Average packs/day: 1.0 packs/day    Types: Cigarettes    Quit date: 11/17/1991    Years since quitting: 33.0   Smokeless tobacco: Never  Vaping Use   Vaping status: Never Used  Substance and Sexual Activity   Alcohol  use: Yes    Alcohol /week: 7.0 standard drinks of alcohol     Types: 7 Standard drinks or equivalent per week    Comment: daily   Drug use: No   Sexual activity: Not on file  Other Topics Concern   Not on file  Social History Narrative   Not on file   Social Drivers of Health   Tobacco Use: Medium Risk (10/28/2024)   Patient History    Smoking Tobacco Use: Former    Smokeless Tobacco Use: Never    Passive Exposure: Not on Actuary Strain: Not on file  Food Insecurity: Not on file  Transportation Needs: Not on file  Physical Activity: Not on file  Stress: Not on file  Social Connections: Not on file  Intimate Partner Violence: Not on file  Depression (PHQ2-9): Low Risk (06/21/2024)   Depression (PHQ2-9)    PHQ-2 Score: 0  Alcohol  Screen: Not on file  Housing: Not  on file  Utilities: Not on file  Health Literacy: Not on file    FAMILY HISTORY: Family History  Problem Relation Age of Onset   Heart attack Mother        possible blood disorder/cancer   Prostate cancer Father    Colon polyps Father 35   Dementia Father    Esophageal cancer Paternal Grandfather    Lung cancer Paternal Grandfather    Colon cancer Neg Hx    Rectal cancer Neg Hx    Stomach cancer Neg Hx    Pancreatic cancer Neg Hx    Liver disease Neg Hx     ALLERGIES:  has no known allergies.  MEDICATIONS:  Current Outpatient Medications  Medication Sig Dispense Refill   ALPRAZolam (XANAX) 0.5 MG tablet Take 0.5 mg by mouth at bedtime as needed for sleep. Take 1/2 -1 tab at bedtime as needed     Ascorbic Acid (VITAMIN C) 100 MG tablet Take 100 mg by mouth daily.     buPROPion (WELLBUTRIN SR) 150 MG 12 hr tablet  Take 150 mg by mouth every morning.     buPROPion (WELLBUTRIN) 75 MG tablet Take 75 mg by mouth every morning.     celecoxib (CELEBREX) 100 MG capsule Take 100 mg by mouth 2 (two) times daily.     cyclobenzaprine (FLEXERIL) 10 MG tablet Take 10 mg by mouth at bedtime.     docusate sodium (COLACE) 100 MG capsule Take 100 mg by mouth 2 (two) times daily.     DULoxetine (CYMBALTA) 30 MG capsule Take 30 mg by mouth daily.     ferrous sulfate  325 (65 FE) MG EC tablet TAKE ONE TABLET BY MOUTH EVERY DAY with breakfast 90 tablet 0   gabapentin (NEURONTIN) 300 MG capsule Take 600 mg by mouth at bedtime. Take two capsules by mouth at bedtime as needed     hydrocortisone 2.5 % ointment Apply 1 g topically 3 (three) times daily.     lisinopril (PRINIVIL,ZESTRIL) 10 MG tablet Take 10 mg by mouth daily.     Multiple Vitamin (MULTIVITAMIN) tablet Take 1 tablet by mouth daily.     Omega-3 Fatty Acids (FISH OIL) 1000 MG CAPS Take 2 capsules by mouth daily.     pantoprazole  (PROTONIX ) 40 MG tablet TAKE 1 TABLET BY MOUTH TWICE A DAY 180 tablet 1   predniSONE  (DELTASONE ) 10 MG tablet Take 2 tablets (20 mg total) by mouth 2 (two) times daily with a meal. 20 tablet 0   saccharomyces boulardii (FLORASTOR) 250 MG capsule Take 500 mg by mouth daily.      tretinoin (RETIN-A) 0.025 % cream tretinoin 0.025 % topical cream  APPLY TO AFFECTED AREA EVERY DAY AT NIGHT     VITAMIN D PO Take 2,000 Units by mouth daily.      zolpidem (AMBIEN) 10 MG tablet Take 10 mg by mouth at bedtime as needed for sleep.     No current facility-administered medications for this visit.    REVIEW OF SYSTEMS:   Constitutional: ( - ) fevers, ( - )  chills , ( - ) night sweats Eyes: ( - ) blurriness of vision, ( - ) double vision, ( - ) watery eyes Ears, nose, mouth, throat, and face: ( - ) mucositis, ( - ) sore throat Respiratory: ( - ) cough, ( - ) dyspnea, ( - ) wheezes Cardiovascular: ( - ) palpitation, ( - ) chest discomfort, ( - ) lower  extremity swelling Gastrointestinal:  ( - )  nausea, ( - ) heartburn, ( -) change in bowel habits Skin: ( - ) abnormal skin rashes Lymphatics: ( - ) new lymphadenopathy, ( - ) easy bruising Neurological: ( - ) numbness, ( - ) tingling, ( - ) new weaknesses Behavioral/Psych: ( - ) mood change, ( - ) new changes  All other systems were reviewed with the patient and are negative.  PHYSICAL EXAMINATION: ECOG PERFORMANCE STATUS: 1 - Symptomatic but completely ambulatory  There were no vitals filed for this visit.    There were no vitals filed for this visit.     GENERAL: well appearing female in NAD  SKIN: skin color, texture, turgor are normal, no rashes or significant lesions EYES: conjunctiva are pink and non-injected, sclera clear LUNGS: clear to auscultation and percussion with normal breathing effort HEART: regular rate & rhythm and no murmurs and no lower extremity edema Musculoskeletal: no cyanosis of digits and no clubbing  PSYCH: alert & oriented x 3, fluent speech NEURO: no focal motor/sensory deficits  LABORATORY DATA:  I have reviewed the data as listed    Latest Ref Rng & Units 09/09/2024    7:46 AM 06/21/2024    7:49 AM 03/11/2024    2:30 PM  CBC  WBC 4.0 - 10.5 K/uL 8.2  9.3  9.9   Hemoglobin 12.0 - 15.0 g/dL 86.6  86.1  87.7   Hematocrit 36.0 - 46.0 % 40.7  41.4  36.4   Platelets 150 - 400 K/uL 528  537  405        Latest Ref Rng & Units 09/09/2024    7:46 AM 12/09/2022   10:40 AM 09/09/2022    8:17 AM  CMP  Glucose 70 - 99 mg/dL 77  65  70   BUN 8 - 23 mg/dL 12  11  13    Creatinine 0.44 - 1.00 mg/dL 9.15  9.10  9.15   Sodium 135 - 145 mmol/L 136  136  135   Potassium 3.5 - 5.1 mmol/L 3.9  4.2  3.8   Chloride 98 - 111 mmol/L 101  103  101   CO2 22 - 32 mmol/L 28  29  29    Calcium 8.9 - 10.3 mg/dL 9.1  9.1  9.1   Total Protein 6.5 - 8.1 g/dL 7.2  6.5  7.0   Total Bilirubin 0.0 - 1.2 mg/dL 0.4  0.3  0.3   Alkaline Phos 38 - 126 U/L 56  61  60   AST 15 - 41  U/L 22  20  19    ALT 0 - 44 U/L 18  14  15       ASSESSMENT & PLAN ARLISHA PATALANO is a 69 y.o. female returns to the clinic for follow up of iron deficiency anemia.   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 --Labs today show no evidence of anemia with hemoglobin 13.8, MCV 90.6.SABRA Iron panel is pending. --Discussed trying to take iron pills every other day if tolerable.  Okay to discontinue iron pills if patient's constipation is unbearable.  We can manage her iron deficiency with IV iron moving forward. --I will follow-up with the patient once iron panel is finalized to determine if she needs IV iron at this time. 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  --continue to monitor.   No orders of the defined types were placed in this encounter.   All questions were answered. The patient knows to call the clinic with any problems, questions or concerns.  I have spent a total of 25 minutes minutes of face-to-face and non-face-to-face time, preparing to see the patient, performing a medically appropriate examination, counseling and educating the patient, ordering medications/tests, documenting clinical information in the electronic health record, and care coordination.   Norleen IVAR Kidney, MD Department of Hematology/Oncology [facility] Phone: [phone] Pager: [phone] Email: [email or web address]    "

## 2025-03-14 ENCOUNTER — Inpatient Hospital Stay: Attending: Physician Assistant

## 2025-06-20 ENCOUNTER — Inpatient Hospital Stay: Admitting: Hematology and Oncology

## 2025-06-20 ENCOUNTER — Inpatient Hospital Stay: Attending: Physician Assistant
